# Patient Record
Sex: Female | Born: 1980 | Race: Black or African American | Hispanic: No | Marital: Single | Smoking: Current every day smoker
Health system: Southern US, Community
[De-identification: ages and names within clinical notes are randomized; demographics above are authoritative.]

## PROBLEM LIST (undated history)

## (undated) DIAGNOSIS — D649 Anemia, unspecified: Secondary | ICD-10-CM

## (undated) HISTORY — PX: JOINT REPLACEMENT: SHX530

## (undated) HISTORY — PX: WRIST SURGERY: SHX841

---
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
## 1999-01-21 ENCOUNTER — Emergency Department (HOSPITAL_COMMUNITY): Admission: EM | Admit: 1999-01-21 | Discharge: 1999-01-21 | Payer: Self-pay | Admitting: Emergency Medicine

## 1999-07-30 ENCOUNTER — Encounter: Payer: Self-pay | Admitting: Emergency Medicine

## 1999-07-30 ENCOUNTER — Emergency Department (HOSPITAL_COMMUNITY): Admission: EM | Admit: 1999-07-30 | Discharge: 1999-07-30 | Payer: Self-pay | Admitting: Emergency Medicine

## 2000-07-10 ENCOUNTER — Encounter: Admission: RE | Admit: 2000-07-10 | Discharge: 2000-07-10 | Payer: Self-pay | Admitting: Family Medicine

## 2000-08-09 ENCOUNTER — Encounter: Admission: RE | Admit: 2000-08-09 | Discharge: 2000-08-09 | Payer: Self-pay | Admitting: Family Medicine

## 2000-12-12 ENCOUNTER — Encounter: Admission: RE | Admit: 2000-12-12 | Discharge: 2000-12-12 | Payer: Self-pay | Admitting: Family Medicine

## 2001-08-19 ENCOUNTER — Encounter: Admission: RE | Admit: 2001-08-19 | Discharge: 2001-08-19 | Payer: Self-pay | Admitting: Family Medicine

## 2001-08-25 ENCOUNTER — Encounter: Admission: RE | Admit: 2001-08-25 | Discharge: 2001-08-25 | Payer: Self-pay | Admitting: Family Medicine

## 2001-09-04 ENCOUNTER — Encounter: Admission: RE | Admit: 2001-09-04 | Discharge: 2001-09-04 | Payer: Self-pay | Admitting: Family Medicine

## 2001-09-09 ENCOUNTER — Ambulatory Visit (HOSPITAL_COMMUNITY): Admission: RE | Admit: 2001-09-09 | Discharge: 2001-09-09 | Payer: Self-pay | Admitting: Family Medicine

## 2001-09-10 ENCOUNTER — Encounter: Admission: RE | Admit: 2001-09-10 | Discharge: 2001-09-10 | Payer: Self-pay | Admitting: Family Medicine

## 2001-09-12 ENCOUNTER — Encounter: Admission: RE | Admit: 2001-09-12 | Discharge: 2001-09-12 | Payer: Self-pay | Admitting: Family Medicine

## 2001-09-29 ENCOUNTER — Inpatient Hospital Stay (HOSPITAL_COMMUNITY): Admission: AD | Admit: 2001-09-29 | Discharge: 2001-09-29 | Payer: Self-pay | Admitting: Obstetrics and Gynecology

## 2001-10-03 ENCOUNTER — Encounter: Admission: RE | Admit: 2001-10-03 | Discharge: 2001-10-03 | Payer: Self-pay | Admitting: Family Medicine

## 2001-10-14 ENCOUNTER — Encounter: Admission: RE | Admit: 2001-10-14 | Discharge: 2001-10-14 | Payer: Self-pay | Admitting: Family Medicine

## 2001-10-20 ENCOUNTER — Encounter: Admission: RE | Admit: 2001-10-20 | Discharge: 2001-10-20 | Payer: Self-pay | Admitting: Family Medicine

## 2001-10-29 ENCOUNTER — Encounter: Admission: RE | Admit: 2001-10-29 | Discharge: 2001-10-29 | Payer: Self-pay | Admitting: Family Medicine

## 2001-11-12 ENCOUNTER — Encounter: Admission: RE | Admit: 2001-11-12 | Discharge: 2001-11-12 | Payer: Self-pay | Admitting: Family Medicine

## 2001-11-13 ENCOUNTER — Encounter: Admission: RE | Admit: 2001-11-13 | Discharge: 2001-11-13 | Payer: Self-pay | Admitting: Family Medicine

## 2001-11-14 ENCOUNTER — Ambulatory Visit (HOSPITAL_COMMUNITY): Admission: RE | Admit: 2001-11-14 | Discharge: 2001-11-14 | Payer: Self-pay | Admitting: Family Medicine

## 2001-11-17 ENCOUNTER — Encounter: Admission: RE | Admit: 2001-11-17 | Discharge: 2001-11-17 | Payer: Self-pay | Admitting: Family Medicine

## 2001-11-19 ENCOUNTER — Encounter: Admission: RE | Admit: 2001-11-19 | Discharge: 2001-11-19 | Payer: Self-pay | Admitting: Family Medicine

## 2001-11-26 ENCOUNTER — Encounter: Admission: RE | Admit: 2001-11-26 | Discharge: 2001-11-26 | Payer: Self-pay | Admitting: Family Medicine

## 2001-12-03 ENCOUNTER — Encounter: Admission: RE | Admit: 2001-12-03 | Discharge: 2001-12-03 | Payer: Self-pay | Admitting: Sports Medicine

## 2001-12-04 ENCOUNTER — Encounter: Admission: RE | Admit: 2001-12-04 | Discharge: 2001-12-04 | Payer: Self-pay | Admitting: Family Medicine

## 2009-02-05 HISTORY — PX: HIP SURGERY: SHX245

## 2011-08-16 ENCOUNTER — Encounter (HOSPITAL_BASED_OUTPATIENT_CLINIC_OR_DEPARTMENT_OTHER): Payer: Self-pay | Admitting: *Deleted

## 2011-08-16 ENCOUNTER — Emergency Department (HOSPITAL_BASED_OUTPATIENT_CLINIC_OR_DEPARTMENT_OTHER)
Admission: EM | Admit: 2011-08-16 | Discharge: 2011-08-16 | Disposition: A | Discharge status: Home / Self Care | Payer: Medicaid Other | Attending: Emergency Medicine | Admitting: Emergency Medicine

## 2011-08-16 DIAGNOSIS — S8000XA Contusion of unspecified knee, initial encounter: Secondary | ICD-10-CM

## 2011-08-16 DIAGNOSIS — S62318A Displaced fracture of base of other metacarpal bone, initial encounter for closed fracture: Secondary | ICD-10-CM

## 2011-08-16 DIAGNOSIS — D649 Anemia, unspecified: Secondary | ICD-10-CM | POA: Insufficient documentation

## 2011-08-16 DIAGNOSIS — W19XXXA Unspecified fall, initial encounter: Secondary | ICD-10-CM | POA: Insufficient documentation

## 2011-08-16 DIAGNOSIS — Y9229 Other specified public building as the place of occurrence of the external cause: Secondary | ICD-10-CM | POA: Insufficient documentation

## 2011-08-16 DIAGNOSIS — S92309A Fracture of unspecified metatarsal bone(s), unspecified foot, initial encounter for closed fracture: Secondary | ICD-10-CM | POA: Insufficient documentation

## 2011-08-16 DIAGNOSIS — F17200 Nicotine dependence, unspecified, uncomplicated: Secondary | ICD-10-CM | POA: Insufficient documentation

## 2011-08-16 HISTORY — DX: Anemia, unspecified: D64.9

## 2011-08-16 MED ORDER — HYDROCODONE-ACETAMINOPHEN 5-325 MG PO TABS
2.0000 | ORAL_TABLET | Freq: Once | ORAL | Status: AC
  Administered 2011-08-16: 2 via ORAL
  Filled 2011-08-16: qty 2

## 2011-08-16 MED ORDER — HYDROCODONE-ACETAMINOPHEN 5-325 MG PO TABS: 1.0000 | ORAL_TABLET | Freq: Four times a day (QID) | ORAL | Status: AC | PRN

## 2011-08-16 NOTE — ED Provider Notes (Signed)
History     CSN: [id number]  Arrival date & time 08/16/11  0018   First MD Initiated Contact with Patient 08/16/11 0102      Chief Complaint  Patient presents with  . Foot Injury    (Consider location/radiation/quality/duration/timing/severity/associated sxs/prior treatment) HPI This is a 31 year old black female who fell at [facility] yesterday afternoon. She has pain and swelling of the right kneecap as well as pain over the base of the left fifth metatarsal. She was seen by her physician yesterday afternoon and had x-rays of her right knee left ankle and left foot. These show no fracture of the right knee or the left ankle but does show a fracture of the base of the left fifth metatarsal. She is having moderate to severe pain, worse with walking. She was given a postop shoe. She states she was given Ultram for pain which has not adequately treated it.   Past Medical History  Diagnosis Date  . Anemia     Past Surgical History  Procedure Date  . Joint replacement     History reviewed. No pertinent family history.  History  Substance Use Topics  . Smoking status: Current Everyday Smoker -- 1.0 packs/day  . Smokeless tobacco: Not on file  . Alcohol Use: No    OB History    Grav Para Term Preterm Abortions TAB SAB Ect Mult Living                  Review of Systems  All other systems reviewed and are negative.    Allergies  Review of patient's allergies indicates no known allergies.  Home Medications  No current outpatient prescriptions on file.  BP 118/70  Pulse 85  Temp 97.8 F (36.6 C) (Oral)  Resp 16  Ht 5' (1.524 m)  Wt 197 lb (89.359 kg)  BMI 38.47 kg/m2  SpO2 100%  Physical Exam General: Well-developed, well-nourished female in no acute distress; appearance consistent with age of record HENT: normocephalic, atraumatic Eyes: Normal appearance Neck: supple Heart: regular rate and rhythm Lungs: Normal respiratory effort and excursion Abdomen:  soft; nondistended Extremities: Tender hematoma over right patella without crepitus; tenderness and ecchymosis over the base of the left fifth metatarsal; left foot distally neurovascularly intact with intact tendon function Neurologic: Awake, alert and oriented; motor function intact in all extremities and symmetric; no facial droop Skin: Warm and dry Psychiatric: Normal mood and affect    ED Course  Procedures (including critical care time)     MDM  Outside x-rays reviewed by myself showing fracture of the base of the left fifth metatarsal. We will place her in a cam walker and refer her to orthopedics.      Hanley Seamen, MD 08/16/11 0127

## 2011-08-16 NOTE — ED Notes (Signed)
Pt c/o fall yesterday seen by PMD  Dx left foot fx , tramadol not working

## 2014-10-27 ENCOUNTER — Emergency Department (HOSPITAL_BASED_OUTPATIENT_CLINIC_OR_DEPARTMENT_OTHER)
Admission: EM | Admit: 2014-10-27 | Discharge: 2014-10-27 | Disposition: A | Discharge status: Home / Self Care | Payer: Medicaid Other | Attending: Emergency Medicine | Admitting: Emergency Medicine

## 2014-10-27 ENCOUNTER — Emergency Department (HOSPITAL_BASED_OUTPATIENT_CLINIC_OR_DEPARTMENT_OTHER): Payer: Medicaid Other

## 2014-10-27 ENCOUNTER — Encounter (HOSPITAL_BASED_OUTPATIENT_CLINIC_OR_DEPARTMENT_OTHER): Payer: Self-pay

## 2014-10-27 DIAGNOSIS — Z720 Tobacco use: Secondary | ICD-10-CM | POA: Insufficient documentation

## 2014-10-27 DIAGNOSIS — R0789 Other chest pain: Secondary | ICD-10-CM | POA: Diagnosis not present

## 2014-10-27 DIAGNOSIS — Z862 Personal history of diseases of the blood and blood-forming organs and certain disorders involving the immune mechanism: Secondary | ICD-10-CM | POA: Diagnosis not present

## 2014-10-27 DIAGNOSIS — Z3202 Encounter for pregnancy test, result negative: Secondary | ICD-10-CM | POA: Diagnosis not present

## 2014-10-27 DIAGNOSIS — R109 Unspecified abdominal pain: Secondary | ICD-10-CM | POA: Diagnosis present

## 2014-10-27 LAB — URINALYSIS, ROUTINE W REFLEX MICROSCOPIC
Bilirubin Urine: NEGATIVE
GLUCOSE, UA: NEGATIVE mg/dL
Hgb urine dipstick: NEGATIVE
Ketones, ur: NEGATIVE mg/dL
LEUKOCYTES UA: NEGATIVE
NITRITE: NEGATIVE
PH: 7.5 (ref 5.0–8.0)
Protein, ur: NEGATIVE mg/dL
SPECIFIC GRAVITY, URINE: 1.026 (ref 1.005–1.030)
Urobilinogen, UA: 1 mg/dL (ref 0.0–1.0)

## 2014-10-27 LAB — PREGNANCY, URINE: Preg Test, Ur: NEGATIVE

## 2014-10-27 MED ORDER — METHOCARBAMOL 500 MG PO TABS: 1000.0000 mg | ORAL_TABLET | Freq: Four times a day (QID) | ORAL | Status: DC | PRN

## 2014-10-27 MED ORDER — IBUPROFEN 800 MG PO TABS
800.0000 mg | ORAL_TABLET | Freq: Once | ORAL | Status: AC
  Administered 2014-10-27: 800 mg via ORAL
  Filled 2014-10-27: qty 1

## 2014-10-27 MED ORDER — METHOCARBAMOL 500 MG PO TABS
750.0000 mg | ORAL_TABLET | Freq: Once | ORAL | Status: AC
Start: 2014-10-27 — End: 2014-10-27
  Administered 2014-10-27: 750 mg via ORAL
  Filled 2014-10-27: qty 2

## 2014-10-27 NOTE — Discharge Instructions (Signed)
For pain control you may take up to 800mg  of Motrin (also known as ibuprofen). That is usually 4 over the counter pills,  3 times a day. Take with food to minimize stomach irritation   For breakthrough pain you may take Robaxin. Do not drink alcohol, drive or operate heavy machinery when taking Robaxin.   Do not hesitate to return to the emergency room for any new, worsening or concerning symptoms.  Please obtain primary care using resource guide below. Let them know that you were seen in the emergency room and that they will need to obtain records for further outpatient management.   Chest Wall Pain Chest wall pain is pain in or around the bones and muscles of your chest. It may take up to 6 weeks to get better. It may take longer if you must stay physically active in your work and activities.  CAUSES  Chest wall pain may happen on its own. However, it may be caused by:  A viral illness like the flu.  Injury.  Coughing.  Exercise.  Arthritis.  Fibromyalgia.  Shingles. HOME CARE INSTRUCTIONS   Avoid overtiring physical activity. Try not to strain or perform activities that cause pain. This includes any activities using your chest or your abdominal and side muscles, especially if heavy weights are used.  Put ice on the sore area.  Put ice in a plastic bag.  Place a towel between your skin and the bag.  Leave the ice on for 15-20 minutes per hour while awake for the first 2 days.  Only take over-the-counter or prescription medicines for pain, discomfort, or fever as directed by your caregiver. SEEK IMMEDIATE MEDICAL CARE IF:   Your pain increases, or you are very uncomfortable.  You have a fever.  Your chest pain becomes worse.  You have new, unexplained symptoms.  You have nausea or vomiting.  You feel sweaty or lightheaded.  You have a cough with phlegm (sputum), or you cough up blood. MAKE SURE YOU:   Understand these instructions.  Will watch your  condition.  Will get help right away if you are not doing well or get worse. Document Released: 01/22/2005 Document Revised: 04/16/2011 Document Reviewed: 09/18/2010 [facility] Patient Information 2015 [place], [place]. This information is not intended to replace advice given to you by your health care provider. Make sure you discuss any questions you have with your health care provider.   Emergency Department Resource Guide 1) Find a Doctor and Pay Out of Pocket Although you won't have to find out who is covered by your insurance plan, it is a good idea to ask around and get recommendations. You will then need to call the office and see if the doctor you have chosen will accept you as a new patient and what types of options they offer for patients who are self-pay. Some doctors offer discounts or will set up payment plans for their patients who do not have insurance, but you will need to ask so you aren't surprised when you get to your appointment.  2) Contact Your Local Health Department Not all health departments have doctors that can see patients for sick visits, but many do, so it is worth a call to see if yours does. If you don't know where your local health department is, you can check in your phone book. The CDC also has a tool to help you locate your state's health department, and many state websites also have listings of all of their local health  departments.  3) Find a Walk-in Clinic If your illness is not likely to be very severe or complicated, you may want to try a walk in clinic. These are popping up all over the country in pharmacies, drugstores, and shopping centers. They're usually staffed by nurse practitioners or physician assistants that have been trained to treat common illnesses and complaints. They're usually fairly quick and inexpensive. However, if you have serious medical issues or chronic medical problems, these are probably not your best option.  No Primary Care  Doctor: - Call Health Connect at  [phone] - they can help you locate a primary care doctor that  accepts your insurance, provides certain services, etc. - Physician Referral Service- [phone]  Chronic Pain Problems: Organization         Address  Phone   Notes  [facility]  [phone] Patients need to be referred by their primary care doctor.   Medication Assistance: Organization         Address  Phone   Notes  [facility] Medication [facility] [address] [place] [place], [place] [address] [phone] --Must be a resident of [facility] -- Must have NO insurance coverage whatsoever (no Medicaid/ Medicare, etc.) -- The pt. MUST have a primary care doctor that directs their care regularly and follows them in the community   [facility]  [phone]   [employer]  [phone]    Agencies that provide inexpensive medical care: Organization         Address  Phone   Notes  [facility]  [phone]   [facility]    [phone]   [facility] [address] [place], [place] [address] [phone]   [facility] [address], [place] [phone]   Planned Parenthood    [phone]   [facility]    [phone]   Community Health and [facility]  [address], [place] Phone:  [phone], Fax:  [phone] Hours of Operation:  9 am - 6 pm, M-F.  Also accepts Medicaid/Medicare and self-pay.  [facility]  [address], [place] Phone: [phone], Fax: [phone]. Hours of Operation:  8:30 am - 5:30 pm, M-F.  Also accepts Medicaid and self-pay.  [facility] [place] [address], [place] Phone: [phone]   [facility] [address] Natasha Bence [place], [place] [phone], Ext. [phone] Mondays & Thursdays: 7-9 AM.  First 15 patients are seen on a first  come, first serve basis.    Medicaid-accepting [facility] Providers:  Organization         Address  Phone   Notes  [facility] [address], [place] [phone] Also accepts self-pay patients.  [facility] [address] Laurell Josephs [place], [place]  [phone]   [facility] [address], [place] [phone]   [facility] [address] [place] [phone]   Renaye Rakers [address], [place]   [phone] Only accepts [place] patients after they have their name applied to their card.   Self-Pay (no insurance) in [facility]:  Organization         Address  Phone   Notes  Sickle Cell Patients, [facility] [address] [place], [place] [phone]   [facility] [address] [place], [place] [phone]   [facility] [place]  [address], [place] [phone]   [facility]/Dr. Osei-Bonsu  [address] [place] or [address], [place] [phone] Phone number for both [place] and [place] locations is the same.  [facility] and [facility] [address] [place] [phone]   [facility] [address], [place] or [address] [phone]   [facility] [address] [place] [phone], phone; [phone], fax Sees patients 1st and 3rd Saturday of every month.  Must not qualify for public or private insurance (i.e. Medicaid, Medicare, [place] Health Choice, Veterans' Benefits)  Household income should be no more than 200% of the poverty level The clinic cannot treat you if you are pregnant or think you are pregnant  Sexually transmitted diseases are not treated at the clinic.    Dental Care: Organization          Address  Phone  Notes  [facility] of [facility] [address] [place], [place] [phone] Accepts children up to age 43 who are enrolled in [place] or [place] Health Choice; pregnant women with a Medicaid card; and children who have applied for Medicaid or [place] Health Choice, but were declined, whose parents can pay a reduced fee at time of service.  [facility] Department of [facility]  [address], [place] [phone] Accepts children up to age 40 who are enrolled in [place] or [place] Health Choice; pregnant women with a Medicaid card; and children who have applied for Medicaid or [place] Health Choice, but were declined, whose parents can pay a reduced fee at time of service.  [facility]  [address] [place], [place] [phone] Patients are seen by appointment only. Walk-ins are not accepted. [facility] will see patients 67 years of age and older. Monday - Tuesday (8am-5pm) Most Wednesdays (8:30-5pm) $30 per visit, cash only  [facility] Access PRO[facility]  [address], [facility] [phone] Patients are seen by appointment only. Walk-ins are not accepted. [facility] will see patients 28 years of age and older. One Wednesday Evening (Monthly: Volunteer Based).  $30 per visit, cash only  [employer]  [phone] for adults; Children under age 69, call Graduate Pediatric Dentistry at [phone]. Children aged 49-14, please call [phone] to request a pediatric application.  Dental services are provided in all areas of dental care including fillings, crowns and bridges, complete and partial dentures, implants, gum treatment, root canals, and extractions. Preventive care is also provided. Treatment is provided to both adults and children. Patients are selected via a lottery and there is often a waiting list.   [facility] [address], [place]  [phone] [email or web address]   [facility] [address] [place], [place] [phone], Ext. [phone] Second and Fourth Thursday of each month, opens at 6:30 AM; Clinic ends at 9 AM.  Patients are seen on a first-come first-served basis, and a limited number are seen during each clinic.   [facility]  [address] Ether Griffins [place], [place] [phone]   Eligibility Requirements You must have lived  in [place], [place], or [place] counties for at least the last three months.   You cannot be eligible for state or federal sponsored National Cityhealthcare insurance, including CIGNAVeterans Administration, [place]icaid, or [employer]icare.   You generally cannot be eligible for healthcare insurance through your employer.    How to apply: Eligibility screenings are held every Tuesday and Wednesday afternoon from 1:00 pm until 4:00 pm. You do not need an appointment for the interview!  [facility] [address], [place], [place] [phone]   [facility]  [phone]   [facility]  [phone]   [facility]  [phone]    Behavioral Health Resources in the Community: Intensive Outpatient Programs Organization         Address  Phone  Notes  [facility] [address], [place], [place] [phone]   [facility]patient [address], [place], [place] [phone]   [facility] [address], [place], [place]  [phone]   [facility] [address],  [place], [place] [phone] or [phone]   Substance Abuse Resources Organization         Address  Phone  Notes  [facility]  [phone]   [facility]  [phone]   [facility]  [phone]   [facility]  [phone]   Residential & Outpatient Substance Abuse Program  [phone]   Psychological  Services Organization         Address  Phone  Notes  [facility]  [phone]- [phone]   [facility]  [phone]- [phone]   [facility] [address], [place] [phone] or [phone]    Mobile Crisis Teams Organization         Address  Phone  Notes  [facility], Mobile Crisis Care Unit  [phone]   [facility]  [address] [place], [place] [phone]   Doristine LocksSharon DeEsch [address] [place] [phone]    Self-Help/Support Groups Organization         Address  Phone             Notes  [facility] [place] - variety of support groups  [phone] Call for more information  Narcotics Anonymous (NA), [facility] [address], [place]  2 meetings at this location   Statisticianesidential Treatment Programs Organization         Address  Phone  Notes  [facility] [address],    [place]  [phone]   [facility]  [address], [place] [phone], [place], [place] [phone]   [facility] [address], [place] [phone] Admissions: 8am-3pm M-F  [facility] [address]    [place], [place] [phone]   [facility] [address], [place], [place] [phone]   [facility] [address]  [place], [place] [phone]   [facility] - Intensive Outpatient [address], [place], [place] [phone]   [facility] ([facility] [address]  [place], [place] [phone] or [phone]   [facility] ([facility]) [address] [place], [place] [phone] Accepts Medicaid  [facility] [address]  [place] [phone] Substance Abuse/Addiction Treatment   [facility] Resources Organization         Address  Phone  Notes  [facility]  [phone]   Angie Fava, PhD [address] Ervin Knack [place], [place]   [phone] or [phone]   [facility] Behavioral   [address] [place], [place] [phone]   [facility] [address], [place], [place] [phone] Insurance/Medicaid/sponsorship through [facility] and Families [address]                                    [place], [place] [phone] Therapy/tele-psych/case  [facility] [address], [place] [phone]    Dr. Lolly Mustache  [phone]   [facility]  [employer] [facility] Dept. 1) [address] [place] 2) [address] [place] 3)  [address], [place] [phone]  [phone]   [facility] Child Abuse Hotline [phone] or [phone] (After Hours)

## 2014-10-27 NOTE — ED Provider Notes (Signed)
CSN: [id number]     Arrival date & time 10/27/14  1320 History   First MD Initiated Contact with Patient 10/27/14 1337     Chief Complaint  Patient presents with  . Flank Pain     (Consider location/radiation/quality/duration/timing/severity/associated sxs/prior Treatment) HPI  Blood pressure 124/62, pulse 74, temperature 98.4 F (36.9 C), temperature source Oral, resp. rate 18, height 5' (1.524 m), weight 173 lb (78.472 kg), last menstrual period 10/07/2014, SpO2 100 %.  Taylor Clark is a 34 y.o. female complaining of left sided lower midaxillary pain. Pain started approximately 1 week ago when she woke up. If she does not move, she has no pain; however, with movement pain is a 8/10, sharp in nature. Occasionally worse with deep inspiration. Denies any radiation of pain. 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 Denies trauma to the area. No urinary complaints, no abdominal complaints, no fever, chills, or night sweats. Endorses URI symptoms including nasal congestion and sinus pressure and non-productive cough x 3-4 days with sick contacts. Some shortness of breath. Denies recent travel or immobilization. No history of VTE. No lower leg pain or swelling.   Past Medical History  Diagnosis Date  . Anemia    Past Surgical History  Procedure Laterality Date  . Joint replacement    . Hip surgery    . Wrist surgery     No family history on file. Social History  Substance Use Topics  . Smoking status: Current Every Day Smoker -- 1.00 packs/day  . Smokeless tobacco: None  . Alcohol Use: No   OB History    No data available     Review of Systems  10 systems reviewed and found to be negative, except as noted in the HPI.  Allergies  Review of patient's allergies indicates no known allergies.  Home Medications   Prior to Admission medications   Medication Sig Start Date End Date Taking?  Authorizing Provider  UNKNOWN TO PATIENT Osteoporosis med   Yes Historical Provider, MD  methocarbamol (ROBAXIN) 500 MG tablet Take 2 tablets (1,000 mg total) by mouth 4 (four) times daily as needed (Pain). 10/27/14   Nicole Pisciotta, PA-C   BP 124/62 mmHg  Pulse 74  Temp(Src) 98.4 F (36.9 C) (Oral)  Resp 18  Ht 5' (1.524 m)  Wt 173 lb (78.472 kg)  BMI 33.79 kg/m2  SpO2 100%  LMP 10/07/2014 Physical Exam  Constitutional: She is oriented to person, place, and time. She appears well-developed and well-nourished. No distress.  HENT:  Head: Normocephalic.  Mouth/Throat: Oropharynx is clear and moist.  Eyes: Conjunctivae and EOM are normal.  Neck: Normal range of motion.  Cardiovascular: Normal rate, regular rhythm and intact distal pulses.   Pulmonary/Chest: Effort normal and breath sounds normal. No stridor. No respiratory distress. She has no wheezes. She has no rales. She exhibits tenderness.  Abdominal: Soft. Bowel sounds are normal. She exhibits no distension and no mass. There is no tenderness. There is no rebound and no guarding.  Genitourinary:  No CVA tenderness to percussion bilaterally  Musculoskeletal: Normal range of motion.  Neurological: She is alert and oriented to person, place, and time.  Psychiatric: She has a normal mood and affect.  Nursing note and vitals reviewed.   ED Course  Procedures (including critical care time) Labs Review Labs Reviewed  URINALYSIS, ROUTINE W REFLEX MICROSCOPIC (NOT AT [facility]) - Abnormal; Notable for the following:  APPearance CLOUDY (*)    All other components within normal limits  PREGNANCY, URINE    Imaging Review Dg Chest 2 View  10/27/2014   CLINICAL DATA:  Left side back pain, cough for 1 week.  Smoker.  EXAM: CHEST  2 VIEW  COMPARISON:  None.  FINDINGS: The heart size and mediastinal contours are within normal limits. Both lungs are clear. The visualized skeletal structures are unremarkable.  IMPRESSION: No active  cardiopulmonary disease.   Electronically Signed   By: Charlett Nose M.D.   On: 10/27/2014 14:10   I have personally reviewed and evaluated these images and lab results as part of my medical decision-making.   EKG Interpretation None      MDM   Final diagnoses:  Chest wall pain    Filed Vitals:   10/27/14 1327  BP: 124/62  Pulse: 74  Temp: 98.4 F (36.9 C)  TempSrc: Oral  Resp: 18  Height: 5' (1.524 m)  Weight: 173 lb (78.472 kg)  SpO2: 100%    Medications  methocarbamol (ROBAXIN) tablet 750 mg (750 mg Oral Given 10/27/14 1422)  ibuprofen (ADVIL,MOTRIN) tablet 800 mg (800 mg Oral Given 10/27/14 1423)    Taylor Clark is a pleasant 34 y.o. female presenting with positional left midaxillary lower pain. This is reproducible to palpation. Patient is active with lifting and is physical at her job. Patient's vital signs are stable, x-ray clear, no CVA tenderness, urinalysis is not consistent with infection, I doubt this is a pyelonephritis. Patient is low risk by Wells criteria and PERC negative.  Evaluation does not show pathology that would require ongoing emergent intervention or inpatient treatment. Pt is hemodynamically stable and mentating appropriately. Discussed findings and plan with patient/guardian, who agrees with care plan. All questions answered. Return precautions discussed and outpatient follow up given.   New Prescriptions   METHOCARBAMOL (ROBAXIN) 500 MG TABLET    Take 2 tablets (1,000 mg total) by mouth 4 (four) times daily as needed (Pain).         Wynetta Emery, PA-C 10/27/14 1458  Marily Memos, MD 10/27/14 [phone]

## 2014-10-27 NOTE — ED Notes (Addendum)
Left flank/mid back pain x 1 week-denies injury-also c/o prod cough

## 2015-12-15 ENCOUNTER — Encounter (HOSPITAL_BASED_OUTPATIENT_CLINIC_OR_DEPARTMENT_OTHER): Payer: Self-pay | Admitting: *Deleted

## 2015-12-15 ENCOUNTER — Emergency Department (HOSPITAL_BASED_OUTPATIENT_CLINIC_OR_DEPARTMENT_OTHER): Payer: Self-pay

## 2015-12-15 ENCOUNTER — Emergency Department (HOSPITAL_BASED_OUTPATIENT_CLINIC_OR_DEPARTMENT_OTHER)
Admission: EM | Admit: 2015-12-15 | Discharge: 2015-12-15 | Disposition: A | Discharge status: Home / Self Care | Payer: Self-pay | Attending: Emergency Medicine | Admitting: Emergency Medicine

## 2015-12-15 DIAGNOSIS — K7689 Other specified diseases of liver: Secondary | ICD-10-CM | POA: Insufficient documentation

## 2015-12-15 DIAGNOSIS — K529 Noninfective gastroenteritis and colitis, unspecified: Secondary | ICD-10-CM | POA: Insufficient documentation

## 2015-12-15 DIAGNOSIS — F17200 Nicotine dependence, unspecified, uncomplicated: Secondary | ICD-10-CM | POA: Insufficient documentation

## 2015-12-15 LAB — URINALYSIS, ROUTINE W REFLEX MICROSCOPIC
Bilirubin Urine: NEGATIVE
GLUCOSE, UA: NEGATIVE mg/dL
HGB URINE DIPSTICK: NEGATIVE
Ketones, ur: NEGATIVE mg/dL
Leukocytes, UA: NEGATIVE
Nitrite: NEGATIVE
Protein, ur: NEGATIVE mg/dL
SPECIFIC GRAVITY, URINE: 1.01 (ref 1.005–1.030)
pH: 7.5 (ref 5.0–8.0)

## 2015-12-15 LAB — COMPREHENSIVE METABOLIC PANEL
ALT: 27 U/L (ref 14–54)
AST: 25 U/L (ref 15–41)
Albumin: 4.2 g/dL (ref 3.5–5.0)
Alkaline Phosphatase: 109 U/L (ref 38–126)
Anion gap: 7 (ref 5–15)
BUN: 10 mg/dL (ref 6–20)
CHLORIDE: 105 mmol/L (ref 101–111)
CO2: 25 mmol/L (ref 22–32)
Calcium: 9.4 mg/dL (ref 8.9–10.3)
Creatinine, Ser: 0.65 mg/dL (ref 0.44–1.00)
GFR calc Af Amer: >60 mL/min (ref >60)
Glucose, Bld: 107 mg/dL — ABNORMAL HIGH (ref 65–99)
Potassium: 4 mmol/L (ref 3.5–5.1)
Sodium: 137 mmol/L (ref 135–145)
Total Bilirubin: 1.3 mg/dL — ABNORMAL HIGH (ref 0.3–1.2)
Total Protein: 7.3 g/dL (ref 6.5–8.1)

## 2015-12-15 LAB — CBC WITH DIFFERENTIAL/PLATELET
BASOS ABS: 0 10*3/uL (ref 0.0–0.1)
Basophils Relative: 0 %
EOS PCT: 1 %
Eosinophils Absolute: 0 10*3/uL (ref 0.0–0.7)
HCT: 44.3 % (ref 36.0–46.0)
HEMOGLOBIN: 15 g/dL (ref 12.0–15.0)
LYMPHS PCT: 24 %
Lymphs Abs: 1.4 10*3/uL (ref 0.7–4.0)
MCH: 30.3 pg (ref 26.0–34.0)
MCHC: 33.9 g/dL (ref 30.0–36.0)
MCV: 89.5 fL (ref 78.0–100.0)
Monocytes Absolute: 0.3 10*3/uL (ref 0.1–1.0)
Monocytes Relative: 5 %
NEUTROS ABS: 4 10*3/uL (ref 1.7–7.7)
NEUTROS PCT: 70 %
PLATELETS: 179 10*3/uL (ref 150–400)
RBC: 4.95 MIL/uL (ref 3.87–5.11)
RDW: 13.1 % (ref 11.5–15.5)
WBC: 5.8 10*3/uL (ref 4.0–10.5)

## 2015-12-15 LAB — PREGNANCY, URINE: Preg Test, Ur: NEGATIVE

## 2015-12-15 LAB — LIPASE, BLOOD: LIPASE: 20 U/L (ref 11–51)

## 2015-12-15 MED ORDER — KETOROLAC TROMETHAMINE 15 MG/ML IJ SOLN
15.0000 mg | Freq: Once | INTRAMUSCULAR | Status: AC
  Administered 2015-12-15: 15 mg via INTRAVENOUS
  Filled 2015-12-15: qty 1

## 2015-12-15 MED ORDER — SODIUM CHLORIDE 0.9 % IV BOLUS (SEPSIS)
1000.0000 mL | Freq: Once | INTRAVENOUS | Status: AC
  Administered 2015-12-15: 1000 mL via INTRAVENOUS

## 2015-12-15 MED ORDER — ONDANSETRON 4 MG PO TBDP: 4.0000 mg | ORAL_TABLET | Freq: Three times a day (TID) | ORAL | 0 refills | Status: DC | PRN

## 2015-12-15 MED ORDER — ONDANSETRON HCL 4 MG/2ML IJ SOLN
4.0000 mg | Freq: Once | INTRAMUSCULAR | Status: AC
  Administered 2015-12-15: 4 mg via INTRAVENOUS
  Filled 2015-12-15: qty 2

## 2015-12-15 MED ORDER — DICYCLOMINE HCL 10 MG/ML IM SOLN
20.0000 mg | Freq: Once | INTRAMUSCULAR | Status: AC
  Administered 2015-12-15: 20 mg via INTRAMUSCULAR
  Filled 2015-12-15: qty 2

## 2015-12-15 MED ORDER — IOPAMIDOL (ISOVUE-300) INJECTION 61%
100.0000 mL | Freq: Once | INTRAVENOUS | Status: AC | PRN
  Administered 2015-12-15: 100 mL via INTRAVENOUS

## 2015-12-15 MED ORDER — DICYCLOMINE HCL 20 MG PO TABS: 20.0000 mg | ORAL_TABLET | Freq: Three times a day (TID) | ORAL | 0 refills | Status: DC

## 2015-12-15 MED FILL — DICYCLOMINE 10 MG CAPSULE: 10 | 6 days supply | Qty: 40 | Fill #0

## 2015-12-15 MED FILL — ONDANSETRON ODT 4 MG TABLET: 4 | 6 days supply | Qty: 20 | Fill #0

## 2015-12-15 NOTE — ED Provider Notes (Signed)
[facility]   CSN: [id number] Arrival date & time: 12/15/15  0831     History   Chief Complaint Chief Complaint  Patient presents with  . Abdominal Pain    HPI Taylor Clark is a 35 y.o. female.  HPI   Abdominal pain started yesterday, generalized, feels like "everything going to fall out", hurts all through back, sharp/dull/cramping. This AM every time pain come breaks into sweat. Waxing and waning pain. Does seem to be associated with episodes of diarrhea. Nausea, no vomiting. Diarrhea, four times this AM, abdominal pain associated with the diarrhea. No recent abx.   Past Medical History:  Diagnosis Date  . Anemia     There are no active problems to display for this patient.   Past Surgical History:  Procedure Laterality Date  . HIP SURGERY    . JOINT REPLACEMENT    . WRIST SURGERY      OB History    No data available       Home Medications    Prior to Admission medications   Medication Sig Start Date End Date Taking? Authorizing Provider  dicyclomine (BENTYL) 20 MG tablet Take 1 tablet (20 mg total) by mouth 3 (three) times daily before meals. 12/15/15   Alvira Monday, MD  ondansetron (ZOFRAN ODT) 4 MG disintegrating tablet Take 1 tablet (4 mg total) by mouth every 8 (eight) hours as needed for nausea or vomiting. 12/15/15   Alvira Monday, MD    Family History History reviewed. No pertinent family history.  Social History Social History  Substance Use Topics  . Smoking status: Current Every Day Smoker    Packs/day: 1.00  . Smokeless tobacco: Never Used  . Alcohol use No     Allergies   Patient has no known allergies.   Review of Systems Review of Systems  Constitutional: Negative for appetite change and fever.  HENT: Negative for sore throat.   Eyes: Negative for visual disturbance.  Respiratory: Negative for cough and shortness of breath.   Cardiovascular: Negative for chest pain.  Gastrointestinal:  Positive for abdominal pain, diarrhea and nausea. Negative for constipation and vomiting.  Genitourinary: Negative for difficulty urinating, vaginal bleeding and vaginal discharge.  Musculoskeletal: Negative for back pain and neck pain.  Skin: Negative for rash.  Neurological: Negative for syncope and headaches.     Physical Exam Updated Vital Signs BP (!) 102/51 (BP Location: Left Arm)   Pulse 77   Temp 97.6 F (36.4 C) (Oral)   Resp 16   Ht 5' (1.524 m)   Wt 175 lb (79.4 kg)   SpO2 99%   BMI 34.18 kg/m   Physical Exam  Constitutional: She is oriented to person, place, and time. She appears well-developed and well-nourished. No distress.  HENT:  Head: Normocephalic and atraumatic.  Eyes: Conjunctivae and EOM are normal.  Neck: Normal range of motion.  Cardiovascular: Normal rate, regular rhythm, normal heart sounds and intact distal pulses.  Exam reveals no gallop and no friction rub.   No murmur heard. Pulmonary/Chest: Effort normal and breath sounds normal. No respiratory distress. She has no wheezes. She has no rales.  Abdominal: Soft. She exhibits no distension. There is tenderness (RLQ, LLQ). There is no guarding.  Musculoskeletal: She exhibits no edema or tenderness.  Neurological: She is alert and oriented to person, place, and time.  Skin: Skin is warm and dry. No rash noted. She is not diaphoretic. No erythema.  Nursing note and vitals reviewed.  ED Treatments / Results  Labs (all labs ordered are listed, but only abnormal results are displayed) Labs Reviewed  COMPREHENSIVE METABOLIC PANEL - Abnormal; Notable for the following:       Result Value   Glucose, Bld 107 (*)    Total Bilirubin 1.3 (*)    All other components within normal limits  CBC WITH DIFFERENTIAL/PLATELET  LIPASE, BLOOD  URINALYSIS, ROUTINE W REFLEX MICROSCOPIC (NOT AT [facility])  PREGNANCY, URINE    EKG  EKG Interpretation None       Radiology Ct Abdomen Pelvis W Contrast  Result  Date: 12/15/2015 CLINICAL DATA:  35 year old with abdominal pain. Nausea and diarrhea. EXAM: CT ABDOMEN AND PELVIS WITH CONTRAST TECHNIQUE: Multidetector CT imaging of the abdomen and pelvis was performed using the standard protocol following bolus administration of intravenous contrast. CONTRAST:  100mL ISOVUE-300 IOPAMIDOL (ISOVUE-300) INJECTION 61% COMPARISON:  None. FINDINGS: Lower chest: Lung bases are clear. Hepatobiliary: There are scattered hyperdense lesions throughout the liver. One of the larger lesions is in the anterior right hepatic lobe measuring up to 9.5 cm. There is another large lesion in the medial right hepatic lobe measuring up to 5.6 cm. Other hyperdense areas are much smaller and nodular. Despite the large number of hyperdense lesions in liver, there is not a large amount of mass effect or liver enlargement. Main portal vein appears to be patent. No biliary dilatation. Normal appearance of the gallbladder. Pancreas: Normal appearance of the pancreas without inflammation or duct dilatation. Spleen: Normal appearance of spleen without enlargement. Adrenals/Urinary Tract: Normal adrenal glands. Normal appearance both kidneys without hydronephrosis. Urinary bladder is unremarkable. Stomach/Bowel: Stomach is within normal limits. Appendix appears normal. No evidence of bowel wall thickening, distention, or inflammatory changes. Vascular/Lymphatic: No significant vascular findings are present. No enlarged abdominal or pelvic lymph nodes. Reproductive: Uterus is unremarkable except for an IUD. No gross abnormality to the ovary/adnexa. Other: No free fluid. No large abdominal wall hernia. Negative for free air. Musculoskeletal: Patient has plate and screw fixation in the right hemipelvis. Both hips are located. IMPRESSION: Abnormal appearance of the liver. There are numerous hyperdense lesions scattered throughout the liver. Differential diagnosis for these hepatic lesions include multiple focal  nodular hyperplasias and multiple hepatic adenomas. The lack of significant mass effect also raises the possibility that this could be related to multiple areas of fat sparing but probably less likely. Recommend further characterization of the liver with MRI with Eovist. Metastatic process or malignant neoplasms are thought to be less likely unless the patient has risk factors. These results were called by telephone at the time of interpretation on 12/15/2015 at 11:44 am to Dr. Alvira MondayERIN Nerissa Constantin , who verbally acknowledged these results. Electronically Signed   By: Richarda OverlieAdam  Henn M.D.   On: 12/15/2015 11:47    Procedures Procedures (including critical care time)  Medications Ordered in ED Medications  sodium chloride 0.9 % bolus 1,000 mL (1,000 mLs Intravenous New Bag/Given 12/15/15 0959)  ondansetron (ZOFRAN) injection 4 mg (4 mg Intravenous Given 12/15/15 1002)  ketorolac (TORADOL) 15 MG/ML injection 15 mg (15 mg Intravenous Given 12/15/15 1004)  dicyclomine (BENTYL) injection 20 mg (20 mg Intramuscular Given 12/15/15 1000)  iopamidol (ISOVUE-300) 61 % injection 100 mL (100 mLs Intravenous Contrast Given 12/15/15 1055)     Initial Impression / Assessment and Plan / ED Course  I have reviewed the triage vital signs and the nursing notes.  Pertinent labs & imaging results that were available during my care of the patient were  reviewed by me and considered in my medical decision making (see chart for details).  Clinical Course    35 year old female presents with concern for abdominal pain and diarrhea.   Patient denies concerns for vaginal discharge or STI, and doubt PID. Urine pregnancy negative. Urinalysis shows no sign of UTI. By history, suspect likely viral gastroenteritis, however patient has significant tenderness on exam, and CT abdomen and pelvis ordered which showed no acute abnormalities, however multiple liver nodules.Suspect patient's symptoms are likely secondary to viral gastroenteritis.  Gave rx for bentyl, zofran. Given abnormal appearance of the liver on CT, with multiple hyperdense lesions, recommend close outpatient MRI. Patient discharged in stable condition with understanding of reasons to return.   Final Clinical Impressions(s) / ED Diagnoses   Final diagnoses:  Liver nodule, multiple  Gastroenteritis    New Prescriptions New Prescriptions   DICYCLOMINE (BENTYL) 20 MG TABLET    Take 1 tablet (20 mg total) by mouth 3 (three) times daily before meals.   ONDANSETRON (ZOFRAN ODT) 4 MG DISINTEGRATING TABLET    Take 1 tablet (4 mg total) by mouth every 8 (eight) hours as needed for nausea or vomiting.     Alvira MondayErin Balraj Brayfield, MD 12/15/15 1200

## 2015-12-15 NOTE — ED Triage Notes (Signed)
Pt reports "severe" lower abd pain with diarrhea and nausea x last night. Denies fevers or any other c/o.

## 2015-12-31 ENCOUNTER — Encounter (HOSPITAL_BASED_OUTPATIENT_CLINIC_OR_DEPARTMENT_OTHER): Payer: Self-pay | Admitting: *Deleted

## 2015-12-31 ENCOUNTER — Emergency Department (HOSPITAL_BASED_OUTPATIENT_CLINIC_OR_DEPARTMENT_OTHER): Payer: Self-pay

## 2015-12-31 ENCOUNTER — Emergency Department (HOSPITAL_BASED_OUTPATIENT_CLINIC_OR_DEPARTMENT_OTHER)
Admission: EM | Admit: 2015-12-31 | Discharge: 2015-12-31 | Disposition: A | Discharge status: Home / Self Care | Payer: Self-pay | Attending: Emergency Medicine | Admitting: Emergency Medicine

## 2015-12-31 DIAGNOSIS — F17200 Nicotine dependence, unspecified, uncomplicated: Secondary | ICD-10-CM | POA: Insufficient documentation

## 2015-12-31 DIAGNOSIS — J181 Lobar pneumonia, unspecified organism: Secondary | ICD-10-CM | POA: Insufficient documentation

## 2015-12-31 DIAGNOSIS — Z79899 Other long term (current) drug therapy: Secondary | ICD-10-CM | POA: Insufficient documentation

## 2015-12-31 DIAGNOSIS — J189 Pneumonia, unspecified organism: Secondary | ICD-10-CM

## 2015-12-31 LAB — RAPID STREP SCREEN (MED CTR MEBANE ONLY): Streptococcus, Group A Screen (Direct): NEGATIVE

## 2015-12-31 MED ORDER — DOXYCYCLINE HYCLATE 100 MG PO TABS
100.0000 mg | ORAL_TABLET | Freq: Once | ORAL | Status: AC
  Administered 2015-12-31: 100 mg via ORAL
  Filled 2015-12-31: qty 1

## 2015-12-31 MED ORDER — DOXYCYCLINE HYCLATE 100 MG PO CAPS: 100.0000 mg | ORAL_CAPSULE | Freq: Two times a day (BID) | ORAL | 0 refills | Status: AC

## 2015-12-31 NOTE — Discharge Instructions (Signed)
Please read and follow all provided instructions.  Your diagnoses today include:  1. Community acquired pneumonia of right upper lobe of lung (HCC)     Tests performed today include: Blood counts and electrolytes Chest x-ray -- shows pneumonia Vital signs. See below for your results today.   Medications prescribed:   Take any prescribed medications only as directed.  Home care instructions:  Follow any educational materials contained in this packet.  Take the complete course of antibiotics that you were prescribed.   BE VERY CAREFUL not to take multiple medicines containing Tylenol (also called acetaminophen). Doing so can lead to an overdose which can damage your liver and cause liver failure and possibly death.   Follow-up instructions: Please follow-up with your primary care provider in the next 3 days for further evaluation of your symptoms and to ensure resolution of your infection.   Return instructions:  Please return to the Emergency Department if you experience worsening symptoms.  Return immediately with worsening breathing, worsening shortness of breath, or if you feel it is taking you more effort to breathe.  Please return if you have any other emergent concerns.  Additional Information:  Your vital signs today were: BP 119/80 (BP Location: Right Arm)    Pulse 100    Temp 98.6 F (37 C)    Resp 18    Ht 5' (1.524 m)    Wt 77.6 kg    LMP 12/28/2015    SpO2 98%    BMI 33.40 kg/m  If your blood pressure (BP) was elevated above 135/85 this visit, please have this repeated by your doctor within one month. --------------

## 2015-12-31 NOTE — ED Notes (Signed)
Productive cough x 1 week, yellow mucus and nasal congestion

## 2015-12-31 NOTE — ED Notes (Signed)
Patient transported to X-ray 

## 2015-12-31 NOTE — ED Provider Notes (Signed)
[facility]   CSN: [id number] Arrival date & time: 12/31/15  1203     History   Chief Complaint Chief Complaint  Patient presents with  . URI    HPI Taylor Clark is a 35 y.o. female.  HPI  35 y.o. female with presents to the Emergency Department today complaining of cough, congestion, sore throat x 1 week. Sick contacts with children. No fevers at home. No N/V/D. No CP/SOB/ABD pain. Notes no relief with OTC remedies. Has generalized body aches that she rates 5/10. No other symptoms noted.     Past Medical History:  Diagnosis Date  . Anemia     There are no active problems to display for this patient.   Past Surgical History:  Procedure Laterality Date  . HIP SURGERY    . JOINT REPLACEMENT    . WRIST SURGERY      OB History    No data available       Home Medications    Prior to Admission medications   Medication Sig Start Date End Date Taking? Authorizing Provider  dicyclomine (BENTYL) 20 MG tablet Take 1 tablet (20 mg total) by mouth 3 (three) times daily before meals. 12/15/15   Alvira MondayErin Schlossman, MD  ondansetron (ZOFRAN ODT) 4 MG disintegrating tablet Take 1 tablet (4 mg total) by mouth every 8 (eight) hours as needed for nausea or vomiting. 12/15/15   Alvira MondayErin Schlossman, MD   Family History No family history on file.  Social History Social History  Substance Use Topics  . Smoking status: Current Every Day Smoker    Packs/day: 1.00  . Smokeless tobacco: Never Used  . Alcohol use No   Allergies   Patient has no known allergies.   Review of Systems Review of Systems  Constitutional: Negative for fever.  HENT: Positive for congestion, rhinorrhea and sore throat.   Respiratory: Positive for cough. Negative for shortness of breath.   Cardiovascular: Negative for chest pain.  Gastrointestinal: Negative for diarrhea, nausea and vomiting.   Physical Exam Updated Vital Signs BP 119/80 (BP Location: Right Arm)   Pulse 100    Temp 98.6 F (37 C)   Resp 18   Ht 5' (1.524 m)   Wt 77.6 kg   SpO2 98%   BMI 33.40 kg/m   Physical Exam  Constitutional: She is oriented to person, place, and time. She appears well-developed and well-nourished. No distress.  HENT:  Head: Normocephalic and atraumatic.  Right Ear: Tympanic membrane, external ear and ear canal normal.  Left Ear: Tympanic membrane, external ear and ear canal normal.  Nose: Nose normal.  Mouth/Throat: Uvula is midline and mucous membranes are normal. No trismus in the jaw. Oropharyngeal exudate and posterior oropharyngeal erythema present. No tonsillar abscesses.  Eyes: EOM are normal. Pupils are equal, round, and reactive to light.  Neck: Normal range of motion. Neck supple. No tracheal deviation present.  Cardiovascular: Normal rate, regular rhythm, S1 normal, S2 normal, normal heart sounds, intact distal pulses and normal pulses.   Pulmonary/Chest: Effort normal. No respiratory distress. She has no decreased breath sounds. She has no wheezes. She has rhonchi in the right upper field. She has no rales.  Abdominal: Normal appearance and bowel sounds are normal. There is no tenderness.  Musculoskeletal: Normal range of motion.  Neurological: She is alert and oriented to person, place, and time.  Skin: Skin is warm and dry.  Psychiatric: She has a normal mood and affect. Her speech is  normal and behavior is normal. Thought content normal.   ED Treatments / Results  Labs (all labs ordered are listed, but only abnormal results are displayed) Labs Reviewed  RAPID STREP SCREEN (NOT AT [facility])  CULTURE, GROUP A STREP [facility]    EKG  EKG Interpretation None      Radiology Dg Chest 2 View  Result Date: 12/31/2015 CLINICAL DATA:  Cough EXAM: CHEST  2 VIEW COMPARISON:  10/27/2014 chest radiograph. FINDINGS: Stable cardiomediastinal silhouette with normal heart size. No pneumothorax. No pleural effusion. Minimal hazy right upper parahilar lung opacity.  Otherwise clear lungs. IMPRESSION: Minimal hazy right upper parahilar lung opacity, nonspecific, cannot exclude developing pneumonia. Recommend follow-up PA and lateral post treatment chest radiographs in 4-6 weeks. Electronically Signed   By: Delbert PhenixJason A Poff M.D.   On: 12/31/2015 13:03    Procedures Procedures (including critical care time)  Medications Ordered in ED Medications - No data to display   Initial Impression / Assessment and Plan / ED Course  I have reviewed the triage vital signs and the nursing notes.  Pertinent labs & imaging results that were available during my care of the patient were reviewed by me and considered in my medical decision making (see chart for details).  Clinical Course    Final Clinical Impressions(s) / ED Diagnoses  {I have reviewed and evaluated the relevant laboratory values. {I have reviewed and evaluated the relevant imaging studies.  {I have reviewed the relevant previous healthcare records.  {I obtained HPI from historian.   ED Course:  Assessment: Pt is a 35yF presents with URI symptoms x 1 week. Sick contacts with children. No fevers. No N/V/D . On exam, pt in NAD. VSS. Afebrile. Lungs with rhonchi RUL, Heart RRR. Abdomen nontender/soft. Rapid strep negative. Pt CXR shows possible pneumonia RUF. DC with CAP ABX. Follow up to PCP. Verbalizes understanding and is agreeable with plan. Pt is hemodynamically stable & in NAD prior to dc.   Disposition/Plan:  DC Home Additional Verbal discharge instructions given and discussed with patient.  Pt Instructed to f/u with PCP in the next week for evaluation and treatment of symptoms. Return precautions given Pt acknowledges and agrees with plan  Supervising Physician Loren Raceravid Yelverton, MD  Final diagnoses:  Community acquired pneumonia of right upper lobe of lung [facility](HCC)    New Prescriptions New Prescriptions   No medications on file     Audry Piliyler Nyema Hachey, PA-C 12/31/15 1409    Loren Raceravid Yelverton,  MD 01/01/16 1401

## 2015-12-31 NOTE — ED Triage Notes (Signed)
Patient states she has a one week history of sinus congestion and productive cough with thick yellow secretions.

## 2016-01-03 LAB — CULTURE, GROUP A STREP (THRC)

## 2016-01-05 ENCOUNTER — Ambulatory Visit (INDEPENDENT_AMBULATORY_CARE_PROVIDER_SITE_OTHER): Payer: BLUE CROSS/BLUE SHIELD | Admitting: Family Medicine

## 2016-01-05 ENCOUNTER — Ambulatory Visit (HOSPITAL_BASED_OUTPATIENT_CLINIC_OR_DEPARTMENT_OTHER)
Admission: RE | Admit: 2016-01-05 | Discharge: 2016-01-05 | Disposition: A | Discharge status: Home / Self Care | Payer: BLUE CROSS/BLUE SHIELD | Source: Ambulatory Visit | Attending: Family Medicine | Admitting: Family Medicine

## 2016-01-05 ENCOUNTER — Encounter: Payer: Self-pay | Admitting: Family Medicine

## 2016-01-05 VITALS — BP 105/72 | HR 88 | Temp 98.1°F | Ht 60.0 in | Wt 179.2 lb

## 2016-01-05 DIAGNOSIS — Z9889 Other specified postprocedural states: Secondary | ICD-10-CM | POA: Diagnosis not present

## 2016-01-05 DIAGNOSIS — Z975 Presence of (intrauterine) contraceptive device: Secondary | ICD-10-CM

## 2016-01-05 DIAGNOSIS — M25551 Pain in right hip: Secondary | ICD-10-CM

## 2016-01-05 DIAGNOSIS — J189 Pneumonia, unspecified organism: Secondary | ICD-10-CM

## 2016-01-05 MED ORDER — DICLOFENAC SODIUM 75 MG PO TBEC: 75.0000 mg | DELAYED_RELEASE_TABLET | Freq: Two times a day (BID) | ORAL | 3 refills | Status: DC

## 2016-01-05 NOTE — Patient Instructions (Signed)
It was very nice to see you today! We will get x-rays of your right hip today and I will let you know how they look.  Please try the voltaren twice a day as needed for your hip pain- let me know if this is helpful for you We will refer you to OB-GYN to discuss your IUD  Please come and see me in about one month to have a repeat chest film so we can make sure your pneumonia is cleared up
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
## 2016-01-05 NOTE — Progress Notes (Signed)
Pre visit review using our clinic review tool, if applicable. No additional management support is needed unless otherwise documented below in the visit note. 

## 2016-02-01 ENCOUNTER — Ambulatory Visit: Payer: BLUE CROSS/BLUE SHIELD | Admitting: Family Medicine

## 2016-02-03 ENCOUNTER — Ambulatory Visit (INDEPENDENT_AMBULATORY_CARE_PROVIDER_SITE_OTHER): Payer: BLUE CROSS/BLUE SHIELD | Admitting: Family Medicine

## 2016-02-03 ENCOUNTER — Encounter: Payer: Self-pay | Admitting: Family Medicine

## 2016-02-03 VITALS — BP 119/84 | HR 77 | Temp 98.2°F | Ht 60.0 in | Wt 185.6 lb

## 2016-02-03 DIAGNOSIS — M25551 Pain in right hip: Secondary | ICD-10-CM | POA: Diagnosis not present

## 2016-02-03 DIAGNOSIS — G8929 Other chronic pain: Secondary | ICD-10-CM | POA: Diagnosis not present

## 2016-02-03 MED ORDER — DULOXETINE HCL 30 MG PO CPEP: 30.0000 mg | ORAL_CAPSULE | Freq: Every day | ORAL | 0 refills | Status: DC

## 2016-02-03 MED ORDER — GABAPENTIN 300 MG PO CAPS: 300.0000 mg | ORAL_CAPSULE | Freq: Three times a day (TID) | ORAL | 0 refills | Status: DC
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
## 2016-02-03 NOTE — Progress Notes (Signed)
Pre visit review using our clinic review tool, if applicable. No additional management support is needed unless otherwise documented below in the visit note. 

## 2016-02-03 NOTE — Patient Instructions (Addendum)
Someone will call you regarding your orthopedic surgery appointment.  Heat.  Try to keep active as tolerated.

## 2016-02-08 ENCOUNTER — Ambulatory Visit: Payer: BLUE CROSS/BLUE SHIELD | Admitting: Family Medicine

## 2016-04-13 ENCOUNTER — Ambulatory Visit: Payer: BLUE CROSS/BLUE SHIELD | Admitting: Family Medicine

## 2016-04-13 ENCOUNTER — Emergency Department (HOSPITAL_BASED_OUTPATIENT_CLINIC_OR_DEPARTMENT_OTHER)
Admission: EM | Admit: 2016-04-13 | Discharge: 2016-04-14 | Disposition: A | Discharge status: Home / Self Care | Payer: BLUE CROSS/BLUE SHIELD | Attending: Emergency Medicine | Admitting: Emergency Medicine

## 2016-04-13 ENCOUNTER — Encounter (HOSPITAL_BASED_OUTPATIENT_CLINIC_OR_DEPARTMENT_OTHER): Payer: Self-pay | Admitting: Emergency Medicine

## 2016-04-13 DIAGNOSIS — F17200 Nicotine dependence, unspecified, uncomplicated: Secondary | ICD-10-CM | POA: Insufficient documentation

## 2016-04-13 DIAGNOSIS — J209 Acute bronchitis, unspecified: Secondary | ICD-10-CM | POA: Insufficient documentation

## 2016-04-13 MED ORDER — ALBUTEROL SULFATE HFA 108 (90 BASE) MCG/ACT IN AERS
2.0000 | INHALATION_SPRAY | RESPIRATORY_TRACT | Status: DC | PRN
  Administered 2016-04-14: 2 via RESPIRATORY_TRACT
  Filled 2016-04-13: qty 6.7

## 2016-04-13 NOTE — ED Triage Notes (Addendum)
Patient states that she has had a fever, chills and aches for the last 3 days. Patient reports that today it is worse. Patient is sniffing and snorting nasal congestion in triage. Patient reports that she has a fever but does not allow this RN to take a proper temper grabs it and states that she knows how to take a temperature.

## 2016-04-13 NOTE — ED Provider Notes (Signed)
[facility] Provider Note: Taylor Clark Taylor Dudas, MD, FACEP  CSN: [id number] MRN: [id number] ARRIVAL: 04/13/16 at 2106 ROOM: MH07/MH07  By signing my name below, I, Taylor Clark, attest that this documentation has been prepared under the direction and in the presence of Taylor LibraJohn Katelynd Blauvelt, MD. Electronically Signed: Alyssa GroveMartin Clark, ED Scribe. 04/13/16. 11:56 PM.   CHIEF COMPLAINT  Generalized Body Aches  HISTORY OF PRESENT ILLNESS  Taylor Clark is a 36 y.o. female who presents to the Emergency Department complaining of constant generalized body aches for 3-4 days. Pt reports associated productive cough, sore throat, headaches and nausea. Cough is productive of clear, foul-tasting phlegm. She has taken "everything over the counter" without significant relief. Pt reports PMHx of bronchitis, but denies PMHx of asthma. She has never used an inhaler. Pt denies fever and vomiting.  Past Medical History:  Diagnosis Date  . Anemia     Past Surgical History:  Procedure Laterality Date  . HIP SURGERY  2011  . JOINT REPLACEMENT    . WRIST SURGERY      Family History  Problem Relation Age of Onset  . Non-Hodgkin's lymphoma Mother   . HIV/AIDS Father     died at age 37  . Anxiety disorder Sister   . Diabetes Maternal Grandmother   . Hypertension Maternal Grandfather   . Diabetes Paternal Grandmother   . Kidney failure Paternal Grandmother     Social History  Substance Use Topics  . Smoking status: Current Some Day Smoker    Packs/day: 1.00  . Smokeless tobacco: Never Used  . Alcohol use No    Prior to Admission medications   Medication Sig Start Date End Date Taking? Authorizing Provider  diclofenac (VOLTAREN) 75 MG EC tablet Take 1 tablet (75 mg total) by mouth 2 (two) times daily. 01/05/16   Taylor FoundJessica C Copland, MD  DULoxetine (CYMBALTA) 30 MG capsule Take 1 capsule (30 mg total) by mouth daily. 02/03/16   Taylor DoryNicholas Paul Wendling, DO  gabapentin (NEURONTIN) 300 MG capsule  Take 1 capsule (300 mg total) by mouth 3 (three) times daily. 02/03/16   Taylor DoryNicholas Paul Wendling, DO    Allergies Hydrocodone and Morphine sulfate   REVIEW OF SYSTEMS  Negative except as noted here or in the History of Present Illness.   PHYSICAL EXAMINATION  Initial Vital Signs Blood pressure 116/76, pulse 72, temperature 98.7 F (37.1 C), temperature source Oral, resp. rate 18, height 5\' 3"  (1.6 m), weight 185 lb (83.9 kg), last menstrual period 03/28/2016, SpO2 100 %.  Examination General: Well-developed, well-nourished female in no acute distress; appearance consistent with age of record HENT: normocephalic; atraumatic, no pharyngeal erythema or exudate Eyes: pupils equal, round and reactive to light; extraocular muscles intact Neck: supple Heart: regular rate and rhythm Lungs: expiratory wheezing; coughing on deep breaths Abdomen: soft; nondistended; nontender; no masses or hepatosplenomegaly; bowel sounds present Extremities: No deformity; full range of motion; pulses normal Neurologic: Awake, alert and oriented; motor function intact in all extremities and symmetric; no facial droop Skin: Warm and dry Psychiatric: Flat affect   RESULTS  Summary of this visit's results, reviewed by myself:   EKG Interpretation  Date/Time:    Ventricular Rate:    PR Interval:    QRS Duration:   QT Interval:    QTC Calculation:   R Axis:     Text Interpretation:        Laboratory Studies: No results found for this or any previous visit (from the past 24  hour(s)). Imaging Studies: Dg Chest 2 View  Result Date: 04/14/2016 CLINICAL DATA:  Fever chills and myalgias for 3 days. Worsened tonight. EXAM: CHEST  2 VIEW COMPARISON:  12/31/2015 FINDINGS: The heart size and mediastinal contours are within normal limits. Both lungs are clear. The visualized skeletal structures are unremarkable. IMPRESSION: No active cardiopulmonary disease. Electronically Signed   By: Taylor Clark M.D.    On: 04/14/2016 00:24    ED COURSE  Nursing notes and initial vitals signs, including pulse oximetry, reviewed.  Vitals:   04/13/16 2111 04/13/16 2112 04/14/16 0002  BP: 116/76    Pulse: 72    Resp: 18    Temp: 98.7 F (37.1 C)    TempSrc: Oral    SpO2: 100%  99%  Weight:  185 lb (83.9 kg)   Height:  5\' 3"  (1.6 m)     PROCEDURES    ED DIAGNOSES     ICD-9-CM ICD-10-CM   1. Acute bronchitis with bronchospasm 466.0 J20.9     I personally performed the services described in this documentation, which was scribed in my presence. The recorded information has been reviewed and is accurate.    Taylor Libra, MD 04/14/16 [phone]

## 2016-04-13 NOTE — ED Notes (Signed)
ED Provider at bedside. 

## 2016-04-14 ENCOUNTER — Encounter (HOSPITAL_BASED_OUTPATIENT_CLINIC_OR_DEPARTMENT_OTHER): Payer: Self-pay | Admitting: *Deleted

## 2016-04-14 ENCOUNTER — Emergency Department (HOSPITAL_BASED_OUTPATIENT_CLINIC_OR_DEPARTMENT_OTHER): Payer: BLUE CROSS/BLUE SHIELD

## 2016-04-14 ENCOUNTER — Emergency Department (HOSPITAL_BASED_OUTPATIENT_CLINIC_OR_DEPARTMENT_OTHER)
Admission: EM | Admit: 2016-04-14 | Discharge: 2016-04-14 | Disposition: A | Discharge status: Home / Self Care | Payer: BLUE CROSS/BLUE SHIELD | Attending: Emergency Medicine | Admitting: Emergency Medicine

## 2016-04-14 DIAGNOSIS — Y999 Unspecified external cause status: Secondary | ICD-10-CM | POA: Diagnosis not present

## 2016-04-14 DIAGNOSIS — X58XXXA Exposure to other specified factors, initial encounter: Secondary | ICD-10-CM | POA: Diagnosis not present

## 2016-04-14 DIAGNOSIS — R040 Epistaxis: Secondary | ICD-10-CM | POA: Diagnosis not present

## 2016-04-14 DIAGNOSIS — F17200 Nicotine dependence, unspecified, uncomplicated: Secondary | ICD-10-CM | POA: Insufficient documentation

## 2016-04-14 DIAGNOSIS — Y929 Unspecified place or not applicable: Secondary | ICD-10-CM | POA: Insufficient documentation

## 2016-04-14 DIAGNOSIS — S0992XA Unspecified injury of nose, initial encounter: Secondary | ICD-10-CM | POA: Diagnosis present

## 2016-04-14 DIAGNOSIS — Y939 Activity, unspecified: Secondary | ICD-10-CM | POA: Diagnosis not present

## 2016-04-14 DIAGNOSIS — S0031XA Abrasion of nose, initial encounter: Secondary | ICD-10-CM | POA: Diagnosis not present

## 2016-04-14 MED ORDER — OXYMETAZOLINE HCL 0.05 % NA SOLN
1.0000 | Freq: Once | NASAL | Status: AC
  Administered 2016-04-14: 1 via NASAL
  Filled 2016-04-14: qty 15

## 2016-04-14 MED ORDER — ONDANSETRON 8 MG PO TBDP: 8.0000 mg | ORAL_TABLET | Freq: Three times a day (TID) | ORAL | 0 refills | Status: DC | PRN

## 2016-04-14 NOTE — ED Triage Notes (Signed)
Patient states she developed a nose bleed this morning while at work. Which lasted for 30 minutes.  States she has had recent uri over the last week.  Using OTC meds with minimal relief.

## 2016-04-14 NOTE — Discharge Instructions (Signed)
Return if any problems.

## 2016-04-14 NOTE — ED Provider Notes (Signed)
[facility] Provider Note   CSN: [id number] Arrival date & time: 04/14/16  1005     History   Chief Complaint Chief Complaint  Patient presents with  . Epistaxis    HPI Taylor Clark is a 36 y.o. female.  The history is provided by the patient. No language interpreter was used.  Epistaxis   This is a new problem. The current episode started less than 1 hour ago. The problem occurs constantly. The problem has been resolved. The bleeding has been from the right nare. She has tried nothing for the symptoms. The treatment provided no relief. Her past medical history does not include nose-picking or frequent nosebleeds.  Pt complains of bleeding from her right nostril    Past Medical History:  Diagnosis Date  . Anemia     There are no active problems to display for this patient.   Past Surgical History:  Procedure Laterality Date  . HIP SURGERY  2011  . JOINT REPLACEMENT    . WRIST SURGERY      OB History    No data available       Home Medications    Prior to Admission medications   Medication Sig Start Date End Date Taking? Authorizing Provider  DULoxetine (CYMBALTA) 30 MG capsule Take 1 capsule (30 mg total) by mouth daily. 02/03/16  Yes Jilda Roche Wendling, DO  ondansetron (ZOFRAN ODT) 8 MG disintegrating tablet Take 1 tablet (8 mg total) by mouth every 8 (eight) hours as needed for nausea or vomiting. 04/14/16  Yes Paula Libra, MD  diclofenac (VOLTAREN) 75 MG EC tablet Take 1 tablet (75 mg total) by mouth 2 (two) times daily. 01/05/16   Gwenlyn Found Copland, MD  gabapentin (NEURONTIN) 300 MG capsule Take 1 capsule (300 mg total) by mouth 3 (three) times daily. 02/03/16   Sharlene Dory, DO    Family History Family History  Problem Relation Age of Onset  . Non-Hodgkin's lymphoma Mother   . HIV/AIDS Father     died at age 17  . Anxiety disorder Sister   . Diabetes Maternal Grandmother   . Hypertension Maternal Grandfather   .  Diabetes Paternal Grandmother   . Kidney failure Paternal Grandmother     Social History Social History  Substance Use Topics  . Smoking status: Current Some Day Smoker    Packs/day: 1.00  . Smokeless tobacco: Never Used  . Alcohol use No     Allergies   Hydrocodone and Morphine sulfate   Review of Systems Review of Systems  HENT: Positive for nosebleeds.   All other systems reviewed and are negative.    Physical Exam Updated Vital Signs BP 126/75 (BP Location: Left Arm)   Pulse 86   Temp 98.7 F (37.1 C) (Oral)   Resp 18   Ht 5\' 3"  (1.6 m)   Wt 83.9 kg   LMP 03/28/2016   SpO2 98%   BMI 32.77 kg/m   Physical Exam  Constitutional: She is oriented to person, place, and time. She appears well-developed and well-nourished.  HENT:  Head: Normocephalic.  Swelling right nasal mucosa, abrasion  No active bleeding  Eyes: EOM are normal. Pupils are equal, round, and reactive to light.  Cardiovascular: Normal rate.   Pulmonary/Chest: Effort normal.  Musculoskeletal: Normal range of motion.  Neurological: She is alert and oriented to person, place, and time.  Skin: Skin is warm.  Psychiatric: She has a normal mood and affect.  Nursing note and vitals  reviewed.    ED Treatments / Results  Labs (all labs ordered are listed, but only abnormal results are displayed) Labs Reviewed - No data to display  EKG  EKG Interpretation None       Radiology Dg Chest 2 View  Result Date: 04/14/2016 CLINICAL DATA:  Fever chills and myalgias for 3 days. Worsened tonight. EXAM: CHEST  2 VIEW COMPARISON:  12/31/2015 FINDINGS: The heart size and mediastinal contours are within normal limits. Both lungs are clear. The visualized skeletal structures are unremarkable. IMPRESSION: No active cardiopulmonary disease. Electronically Signed   By: Ellery Plunkaniel R Mitchell M.D.   On: 04/14/2016 00:24    Procedures Procedures (including critical care time)  Medications Ordered in  ED Medications  oxymetazoline (AFRIN) 0.05 % nasal spray 1 spray (1 spray Each Nare Given 04/14/16 1213)     Initial Impression / Assessment and Plan / ED Course  I have reviewed the triage vital signs and the nursing notes.  Pertinent labs & imaging results that were available during my care of the patient were reviewed by me and considered in my medical decision making (see chart for details).       Final Clinical Impressions(s) / ED Diagnoses   Final diagnoses:  Epistaxis    New Prescriptions Discharge Medication List as of 04/14/2016 12:00 PM    An After Visit Summary was printed and given to the patient.   Lonia SkinnerLeslie K RossmoorSofia, PA-C 04/14/16 1626    Pricilla LovelessScott Goldston, MD 04/15/16 [phone]

## 2016-04-14 NOTE — ED Triage Notes (Signed)
[facility] report the patient developed a nose bleed approximately 50 minutes pta.  States nose bleed was controlled at home.  States patient has not felt well for the last 3 days, [facility] a [facility] and received an inhaler.  VS: B/P 126 78; HR 90, RR 16.

## 2016-04-17 ENCOUNTER — Encounter: Payer: Self-pay | Admitting: Family Medicine

## 2016-06-26 ENCOUNTER — Encounter (HOSPITAL_BASED_OUTPATIENT_CLINIC_OR_DEPARTMENT_OTHER): Payer: Self-pay | Admitting: Emergency Medicine

## 2016-06-26 ENCOUNTER — Emergency Department (HOSPITAL_BASED_OUTPATIENT_CLINIC_OR_DEPARTMENT_OTHER): Payer: BLUE CROSS/BLUE SHIELD

## 2016-06-26 ENCOUNTER — Emergency Department (HOSPITAL_BASED_OUTPATIENT_CLINIC_OR_DEPARTMENT_OTHER)
Admission: EM | Admit: 2016-06-26 | Discharge: 2016-06-27 | Disposition: A | Discharge status: Home / Self Care | Payer: BLUE CROSS/BLUE SHIELD | Attending: Emergency Medicine | Admitting: Emergency Medicine

## 2016-06-26 DIAGNOSIS — Y929 Unspecified place or not applicable: Secondary | ICD-10-CM | POA: Insufficient documentation

## 2016-06-26 DIAGNOSIS — W500XXA Accidental hit or strike by another person, initial encounter: Secondary | ICD-10-CM | POA: Insufficient documentation

## 2016-06-26 DIAGNOSIS — R1110 Vomiting, unspecified: Secondary | ICD-10-CM | POA: Insufficient documentation

## 2016-06-26 DIAGNOSIS — M79675 Pain in left toe(s): Secondary | ICD-10-CM | POA: Insufficient documentation

## 2016-06-26 DIAGNOSIS — Y9389 Activity, other specified: Secondary | ICD-10-CM | POA: Insufficient documentation

## 2016-06-26 DIAGNOSIS — B9789 Other viral agents as the cause of diseases classified elsewhere: Secondary | ICD-10-CM

## 2016-06-26 DIAGNOSIS — Y999 Unspecified external cause status: Secondary | ICD-10-CM | POA: Insufficient documentation

## 2016-06-26 DIAGNOSIS — J069 Acute upper respiratory infection, unspecified: Secondary | ICD-10-CM | POA: Insufficient documentation

## 2016-06-26 DIAGNOSIS — F17200 Nicotine dependence, unspecified, uncomplicated: Secondary | ICD-10-CM | POA: Insufficient documentation

## 2016-06-26 NOTE — ED Provider Notes (Signed)
[facility] Provider Note   CSN: [id number] Arrival date & time: 06/26/16  2129  By signing my name below, I, Deland PrettySherilynn Knight, attest that this documentation has been prepared under the direction and in the presence of [place] Russo, [place] Electronically Signed: Deland PrettySherilynn Knight, ED Scribe. 06/26/16. 11:54 PM.  History   Chief Complaint Chief Complaint  Patient presents with  . Foot Injury   The history is provided by the patient. No language interpreter was used.     HPI Comments: Taylor Clark is a 36 y.o. female who presents to the Emergency Department complaining of left fourth toe pain after playing with her children and accidentally hitting it against her child's knee. Moving her toe and walking exacerbates her pain. She reports that she has not taken OTC medications to treat her symptoms and has not applied a cool compress. Denies numbness and tingling, or previous injury to toe.  The pt additionally reports of cold symptoms that also began a week ago: including an associated non-productive cough, sore throat, vomiting (after coughing) and nasal congestion. The pt attests that she took Nyquil, Dayquil, and Musinex with no relief. She states the her daughter is a possible positive contact. She denies abdominal pain, difficulty swallowing or breathing, chest pain, shortness of breath, or fever.  The pt denies a PMHx of PUD or GI bleed.  Past Medical History:  Diagnosis Date  . Anemia     There are no active problems to display for this patient.   Past Surgical History:  Procedure Laterality Date  . HIP SURGERY  2011  . JOINT REPLACEMENT    . WRIST SURGERY      OB History    No data available       Home Medications    Prior to Admission medications   Medication Sig Start Date End Date Taking? Authorizing Provider  diclofenac (VOLTAREN) 75 MG EC tablet Take 1 tablet (75 mg total) by mouth 2 (two) times daily. 01/05/16   Copland, Gwenlyn FoundJessica C, MD    DULoxetine (CYMBALTA) 30 MG capsule Take 1 capsule (30 mg total) by mouth daily. 02/03/16   Sharlene DoryWendling, Nicholas Paul, DO  gabapentin (NEURONTIN) 300 MG capsule Take 1 capsule (300 mg total) by mouth 3 (three) times daily. 02/03/16   Sharlene DoryWendling, Nicholas Paul, DO  ondansetron (ZOFRAN ODT) 8 MG disintegrating tablet Take 1 tablet (8 mg total) by mouth every 8 (eight) hours as needed for nausea or vomiting. 04/14/16   Molpus, Jonny RuizJohn, MD    Family History Family History  Problem Relation Age of Onset  . Non-Hodgkin's lymphoma Mother   . HIV/AIDS Father        died at age 37  . Anxiety disorder Sister   . Diabetes Maternal Grandmother   . Hypertension Maternal Grandfather   . Diabetes Paternal Grandmother   . Kidney failure Paternal Grandmother     Social History Social History  Substance Use Topics  . Smoking status: Current Some Day Smoker    Packs/day: 1.00  . Smokeless tobacco: Never Used  . Alcohol use No     Allergies   Hydrocodone and Morphine sulfate   Review of Systems Review of Systems  Constitutional: Negative for fever.  HENT: Positive for congestion and sore throat. Negative for trouble swallowing.   Respiratory: Positive for cough. Negative for shortness of breath.   Cardiovascular: Negative for chest pain.  Gastrointestinal: Positive for vomiting. Negative for abdominal pain.  Musculoskeletal: Positive for arthralgias and myalgias.  Neurological: Negative for numbness.     Physical Exam Updated Vital Signs BP 113/63 (BP Location: Right Arm)   Pulse 83   Temp 98.7 F (37.1 C) (Oral)   Resp 18   Ht 5\' 3"  (1.6 m)   Wt 77.1 kg (170 lb)   SpO2 96%   BMI 30.11 kg/m   Physical Exam  Constitutional: She appears well-developed and well-nourished. No distress.  Well-appearing  HENT:  Head: Normocephalic and atraumatic.  Left Ear: Tympanic membrane, external ear and ear canal normal.  Nose: Nose normal.  Mouth/Throat: Uvula is midline, oropharynx is clear  and moist and mucous membranes are normal. No trismus in the jaw. No uvula swelling. No posterior oropharyngeal edema or posterior oropharyngeal erythema. No tonsillar exudate.  Right ear with cerumen impaction, TM not visualized  Eyes: Conjunctivae are normal.  Neck: Normal range of motion. Neck supple.  Cardiovascular: Normal rate, regular rhythm, normal heart sounds and intact distal pulses.  Exam reveals no gallop and no friction rub.   No murmur heard. Pulmonary/Chest: Effort normal and breath sounds normal. No respiratory distress. She has no wheezes. She has no rales. She exhibits no tenderness.  Abdominal: Soft. Bowel sounds are normal. She exhibits no distension. There is no tenderness. There is no rebound and no guarding.  Musculoskeletal:  Left fourth toe with mild edema, no ecchymosis or deformity. Normal ROM though with pain  Lymphadenopathy:    She has no cervical adenopathy.  Neurological: She is alert. No sensory deficit.  Psychiatric: She has a normal mood and affect. Her behavior is normal.  Nursing note and vitals reviewed.    ED Treatments / Results   DIAGNOSTIC STUDIES: Oxygen Saturation is 96% on RA, adequate by my interpretation.   COORDINATION OF CARE: 11:54 PM-Discussed next steps with pt. Pt verbalized understanding and is agreeable with the plan.   Labs (all labs ordered are listed, but only abnormal results are displayed) Labs Reviewed - No data to display  EKG  EKG Interpretation None       Radiology Dg Foot Complete Left  Result Date: 06/26/2016 CLINICAL DATA:  Accidentally kicked an object, with injury to the left fourth and fifth metatarsals. Left foot pain. Initial encounter. EXAM: LEFT FOOT - COMPLETE 3+ VIEW COMPARISON:  None. FINDINGS: There is no evidence of fracture or dislocation. The joint spaces are preserved. There is no evidence of talar subluxation; the subtalar joint is unremarkable in appearance. No significant soft tissue  abnormalities are seen. IMPRESSION: No evidence of fracture or dislocation. Electronically Signed   By: Roanna Raider M.D.   On: 06/26/2016 22:16    Procedures Procedures (including critical care time)  Medications Ordered in ED Medications - No data to display   Initial Impression / Assessment and Plan / ED Course  I have reviewed the triage vital signs and the nursing notes.  Pertinent labs & imaging results that were available during my care of the patient were reviewed by me and considered in my medical decision making (see chart for details).     Patient with left fourth toe pain and viral URI. X-ray foot negative for acute fracture or dislocation. Left foot and toe NV intact. Patient well-appearing, without uvula swelling or trismus, lungs CTAB, afebrile. Patient safe for discharge with symptomatic management. PCP follow-up as needed.  Discussed results, findings, treatment and follow up. Patient advised of return precautions. Patient verbalized understanding and agreed with plan.   Final Clinical Impressions(s) / ED Diagnoses  Final diagnoses:  Toe pain, left  Viral URI with cough    New Prescriptions Discharge Medication List as of 06/26/2016 11:50 PM     I personally performed the services described in this documentation, which was scribed in my presence. The recorded information has been reviewed and is accurate.      Russo, [place] N, PA-C 06/27/16 0108    Charlynne Pander, MD 06/27/16 [phone]

## 2016-06-26 NOTE — ED Triage Notes (Signed)
Pt c/o left foot pain after accidentally kicking her child in the knee. Pt also c/o non-productive cough x 1 week.

## 2016-06-26 NOTE — Discharge Instructions (Signed)
Please read instructions below.  You can take tylenol or advil as needed for sore throat and toe pain.  Drink plenty of water.  Elevate your foot when possible. Apply ice to your toe for 15 minutes at a time. Follow up with your primary care provider as needed.  Return to the ER for difficulty swallowing liquids, difficulty breathing, or new or worsening symptoms.

## 2016-06-27 NOTE — ED Notes (Signed)
Pt verbalizes understanding of d/c instructions and denies any further needs at this time. 

## 2017-01-28 ENCOUNTER — Emergency Department (HOSPITAL_BASED_OUTPATIENT_CLINIC_OR_DEPARTMENT_OTHER)
Admission: EM | Admit: 2017-01-28 | Discharge: 2017-01-28 | Disposition: A | Discharge status: Home / Self Care | Payer: Self-pay | Attending: Emergency Medicine | Admitting: Emergency Medicine

## 2017-01-28 ENCOUNTER — Other Ambulatory Visit: Payer: Self-pay

## 2017-01-28 ENCOUNTER — Encounter (HOSPITAL_BASED_OUTPATIENT_CLINIC_OR_DEPARTMENT_OTHER): Payer: Self-pay | Admitting: Adult Health

## 2017-01-28 DIAGNOSIS — J069 Acute upper respiratory infection, unspecified: Secondary | ICD-10-CM

## 2017-01-28 DIAGNOSIS — F17210 Nicotine dependence, cigarettes, uncomplicated: Secondary | ICD-10-CM | POA: Insufficient documentation

## 2017-01-28 DIAGNOSIS — J111 Influenza due to unidentified influenza virus with other respiratory manifestations: Secondary | ICD-10-CM | POA: Insufficient documentation

## 2017-01-28 DIAGNOSIS — R69 Illness, unspecified: Secondary | ICD-10-CM

## 2017-01-28 MED ORDER — ONDANSETRON 4 MG PO TBDP: 4.0000 mg | ORAL_TABLET | Freq: Three times a day (TID) | ORAL | 0 refills | Status: DC | PRN

## 2017-01-28 MED ORDER — KETOROLAC TROMETHAMINE 30 MG/ML IJ SOLN
30.0000 mg | Freq: Once | INTRAMUSCULAR | Status: AC
  Administered 2017-01-28: 30 mg via INTRAMUSCULAR
  Filled 2017-01-28: qty 1

## 2017-01-28 MED ORDER — FLUTICASONE PROPIONATE 50 MCG/ACT NA SUSP: 1.0000 | Freq: Every day | NASAL | 2 refills | Status: DC

## 2017-01-28 MED ORDER — NAPROXEN 500 MG PO TABS: 500.0000 mg | ORAL_TABLET | Freq: Two times a day (BID) | ORAL | 0 refills | Status: DC

## 2017-01-28 MED ORDER — BENZONATATE 200 MG PO CAPS: 200.0000 mg | ORAL_CAPSULE | Freq: Three times a day (TID) | ORAL | 0 refills | Status: DC | PRN

## 2017-01-28 NOTE — ED Triage Notes (Addendum)
Presents with 2 days of cough, congestion, ear pain, , body aches, headache and generalized fatigue. She took cough medicine and tylenol without relief. ALl three of her children have been sick with similair symptoms.

## 2017-01-28 NOTE — Discharge Instructions (Signed)
Please read attached information regarding your condition. Take Tessalon Perles as needed for cough.  Take Flonase as needed for nasal congestion.  Take Zofran as needed for nausea. Take naproxen as needed for fevers and body aches. Follow-up with your primary care provider for further evaluation. Return to ED for worse symptoms, chest pain, shortness of breath, coughing up blood, increased vomiting, lightheadedness or loss of consciousness.

## 2017-01-28 NOTE — ED Provider Notes (Signed)
[facility] EMERGENCY DEPARTMENT Provider Note   CSN: [id number] Arrival date & time: 01/28/17  1611     History   Chief Complaint Chief Complaint  Patient presents with  . URI    HPI Taylor AbrahamCassandra L Derrell Lollingngram is a 36 y.o. female who presents to ED for evaluation of 3-day history of cough, headache, nasal congestion, body aches, nausea and left-sided ear pain.  She states that she took DayQuil, and other over-the-counter congestion medication and Tylenol without much improvement of symptoms.  States that 3 of her children had similar symptoms over the past several weeks.  She reports a dry cough.  She did not receive her influenza vaccine this year.  She denies any chest pain, hemoptysis, wheezing, trouble breathing, trouble swallowing, sore throat, fevers, head injuries.  HPI  Past Medical History:  Diagnosis Date  . Anemia     There are no active problems to display for this patient.   Past Surgical History:  Procedure Laterality Date  . HIP SURGERY  2011  . JOINT REPLACEMENT    . WRIST SURGERY      OB History    No data available       Home Medications    Prior to Admission medications   Medication Sig Start Date End Date Taking? Authorizing Provider  acetaminophen (TYLENOL) 325 MG tablet Take 650 mg by mouth every 6 (six) hours as needed.   Yes [provider]  benzonatate (TESSALON) 200 MG capsule Take 1 capsule (200 mg total) by mouth 3 (three) times daily as needed for cough. 01/28/17   Josceline Chenard, PA-C  diclofenac (VOLTAREN) 75 MG EC tablet Take 1 tablet (75 mg total) by mouth 2 (two) times daily. 01/05/16   Copland, Gwenlyn FoundJessica C, MD  DULoxetine (CYMBALTA) 30 MG capsule Take 1 capsule (30 mg total) by mouth daily. 02/03/16   Sharlene DoryWendling, Nicholas Paul, DO  fluticasone (FLONASE) 50 MCG/ACT nasal spray Place 1 spray into both nostrils daily. 01/28/17   Delos Klich, PA-C  gabapentin (NEURONTIN) 300 MG capsule Take 1 capsule (300 mg total) by mouth 3  (three) times daily. 02/03/16   Sharlene DoryWendling, Nicholas Paul, DO  naproxen (NAPROSYN) 500 MG tablet Take 1 tablet (500 mg total) by mouth 2 (two) times daily. 01/28/17   Denisa Enterline, PA-C  ondansetron (ZOFRAN ODT) 4 MG disintegrating tablet Take 1 tablet (4 mg total) by mouth every 8 (eight) hours as needed for nausea or vomiting. 01/28/17   Dietrich PatesKhatri, Onna Nodal, PA-C    Family History Family History  Problem Relation Age of Onset  . Non-Hodgkin's lymphoma Mother   . HIV/AIDS Father        died at age 631  . Anxiety disorder Sister   . Diabetes Maternal Grandmother   . Hypertension Maternal Grandfather   . Diabetes Paternal Grandmother   . Kidney failure Paternal Grandmother     Social History Social History   Tobacco Use  . Smoking status: Current Some Day Smoker    Packs/day: 1.00  . Smokeless tobacco: Never Used  Substance Use Topics  . Alcohol use: No  . Drug use: No     Allergies   Hydrocodone and Morphine sulfate   Review of Systems Review of Systems  Constitutional: Positive for appetite change. Negative for chills and fever.  HENT: Positive for congestion, ear pain, postnasal drip, rhinorrhea and sneezing. Negative for drooling, ear discharge, nosebleeds, sore throat, trouble swallowing and voice change.   Respiratory: Positive for cough. Negative for shortness  of breath.   Cardiovascular: Negative for chest pain.  Gastrointestinal: Positive for nausea. Negative for abdominal pain and vomiting.  Genitourinary: Negative for dysuria.  Musculoskeletal: Positive for myalgias. Negative for neck pain and neck stiffness.  Skin: Negative for rash.     Physical Exam Updated Vital Signs BP 121/79   Pulse 84   Temp 98.6 F (37 C) (Oral)   Resp 18   Wt 77.1 kg (170 lb)   SpO2 96%   BMI 30.11 kg/m   Physical Exam  Constitutional: She appears well-developed and well-nourished. No distress.  Nontoxic appearing and in no acute distress.  HENT:  Head: Normocephalic and  atraumatic.  Right Ear: Tympanic membrane is not erythematous, not retracted and not bulging. A middle ear effusion is present.  Left Ear: Tympanic membrane is not erythematous, not retracted and not bulging. A middle ear effusion is present.  Nose: Rhinorrhea present.  Mouth/Throat: Uvula is midline. No posterior oropharyngeal edema or posterior oropharyngeal erythema. No tonsillar exudate.  Eyes: Conjunctivae and EOM are normal. No scleral icterus.  Neck: Normal range of motion.  Cardiovascular: Normal rate, regular rhythm and normal heart sounds.  Pulmonary/Chest: Effort normal and breath sounds normal. No respiratory distress.  Neurological: She is alert.  Skin: No rash noted. She is not diaphoretic.  Psychiatric: She has a normal mood and affect.  Nursing note and vitals reviewed.    ED Treatments / Results  Labs (all labs ordered are listed, but only abnormal results are displayed) Labs Reviewed - No data to display  EKG  EKG Interpretation None       Radiology No results found.  Procedures Procedures (including critical care time)  Medications Ordered in ED Medications  ketorolac (TORADOL) 30 MG/ML injection 30 mg (not administered)     Initial Impression / Assessment and Plan / ED Course  I have reviewed the triage vital signs and the nursing notes.  Pertinent labs & imaging results that were available during my care of the patient were reviewed by me and considered in my medical decision making (see chart for details).     Patient presents to ED for evaluation of 3-day history of cough, headache, nasal congestion, body aches and nausea as well as left-sided ear pain.  Did not receive her influenza vaccine this year.  Sick contacts with 3 children with similar symptoms in the past several weeks.  She is otherwise healthy with no other medical issues or daily medication use.  On physical exam she is overall well-appearing.  She is afebrile here.  Satting at  96-98% on room air.  She is not tachycardic or tachypneic.  She does have some TM congestion and nasal congestion noted.  Lungs are clear to auscultation bilaterally.  I have low suspicion for pneumonia or other severe infectious process for her symptoms.  I suspect influenza-like illness.  Will give symptomatic treatment and advised her to follow-up with primary care provider for further evaluation.  Given Toradol here for body aches and headache.  Patient appears stable for discharge at this time.  Strict return precautions given.  Final Clinical Impressions(s) / ED Diagnoses   Final diagnoses:  Influenza-like illness  Viral upper respiratory tract infection    ED Discharge Orders        Ordered    benzonatate (TESSALON) 200 MG capsule  3 times daily PRN     01/28/17 1818    fluticasone (FLONASE) 50 MCG/ACT nasal spray  Daily     01/28/17 1818  ondansetron (ZOFRAN ODT) 4 MG disintegrating tablet  Every 8 hours PRN     01/28/17 1818    naproxen (NAPROSYN) 500 MG tablet  2 times daily     01/28/17 1818     Portions of this note were generated with Dragon dictation software. Dictation errors may occur despite best attempts at proofreading.    Dietrich Pates, PA-C 01/28/17 1823    Maia Plan, MD 01/29/17 1212

## 2017-05-12 ENCOUNTER — Other Ambulatory Visit: Payer: Self-pay

## 2017-05-12 ENCOUNTER — Emergency Department (HOSPITAL_BASED_OUTPATIENT_CLINIC_OR_DEPARTMENT_OTHER)
Admission: EM | Admit: 2017-05-12 | Discharge: 2017-05-13 | Disposition: A | Discharge status: Home / Self Care | Payer: Self-pay | Attending: Emergency Medicine | Admitting: Emergency Medicine

## 2017-05-12 ENCOUNTER — Encounter (HOSPITAL_BASED_OUTPATIENT_CLINIC_OR_DEPARTMENT_OTHER): Payer: Self-pay | Admitting: *Deleted

## 2017-05-12 DIAGNOSIS — R1110 Vomiting, unspecified: Secondary | ICD-10-CM | POA: Insufficient documentation

## 2017-05-12 DIAGNOSIS — R040 Epistaxis: Secondary | ICD-10-CM | POA: Insufficient documentation

## 2017-05-12 DIAGNOSIS — R0789 Other chest pain: Secondary | ICD-10-CM | POA: Insufficient documentation

## 2017-05-12 DIAGNOSIS — F17210 Nicotine dependence, cigarettes, uncomplicated: Secondary | ICD-10-CM | POA: Insufficient documentation

## 2017-05-12 DIAGNOSIS — B9789 Other viral agents as the cause of diseases classified elsewhere: Secondary | ICD-10-CM | POA: Insufficient documentation

## 2017-05-12 DIAGNOSIS — Z79899 Other long term (current) drug therapy: Secondary | ICD-10-CM | POA: Insufficient documentation

## 2017-05-12 DIAGNOSIS — R070 Pain in throat: Secondary | ICD-10-CM | POA: Insufficient documentation

## 2017-05-12 DIAGNOSIS — R05 Cough: Secondary | ICD-10-CM | POA: Insufficient documentation

## 2017-05-12 DIAGNOSIS — R0602 Shortness of breath: Secondary | ICD-10-CM | POA: Insufficient documentation

## 2017-05-12 DIAGNOSIS — J069 Acute upper respiratory infection, unspecified: Secondary | ICD-10-CM | POA: Insufficient documentation

## 2017-05-12 NOTE — ED Triage Notes (Signed)
Pt c/o 2 days sore throat, cough, body aches, chills, chest and back pain, states "everytime I smell food I throw up"

## 2017-05-13 ENCOUNTER — Emergency Department (HOSPITAL_BASED_OUTPATIENT_CLINIC_OR_DEPARTMENT_OTHER): Payer: Self-pay

## 2017-05-13 ENCOUNTER — Other Ambulatory Visit: Payer: Self-pay

## 2017-05-13 LAB — URINALYSIS, ROUTINE W REFLEX MICROSCOPIC
Bilirubin Urine: NEGATIVE
GLUCOSE, UA: NEGATIVE mg/dL
Hgb urine dipstick: NEGATIVE
Ketones, ur: NEGATIVE mg/dL
LEUKOCYTES UA: NEGATIVE
Nitrite: NEGATIVE
PROTEIN: NEGATIVE mg/dL
Specific Gravity, Urine: 1.02 (ref 1.005–1.030)
pH: 6.5 (ref 5.0–8.0)

## 2017-05-13 LAB — PREGNANCY, URINE: Preg Test, Ur: NEGATIVE

## 2017-05-13 MED ORDER — ALBUTEROL SULFATE HFA 108 (90 BASE) MCG/ACT IN AERS: 2.0000 | INHALATION_SPRAY | RESPIRATORY_TRACT | 0 refills | Status: DC | PRN

## 2017-05-13 MED ORDER — IBUPROFEN 800 MG PO TABS
800.0000 mg | ORAL_TABLET | Freq: Once | ORAL | Status: AC
  Administered 2017-05-13: 800 mg via ORAL
  Filled 2017-05-13: qty 1

## 2017-05-13 MED ORDER — LORATADINE 10 MG PO TABS: 10.0000 mg | ORAL_TABLET | Freq: Every day | ORAL | 0 refills | Status: DC

## 2017-05-13 MED ORDER — ALBUTEROL SULFATE HFA 108 (90 BASE) MCG/ACT IN AERS
INHALATION_SPRAY | RESPIRATORY_TRACT | Status: AC
  Filled 2017-05-13: qty 6.7

## 2017-05-13 MED ORDER — OXYMETAZOLINE HCL 0.05 % NA SOLN
1.0000 | Freq: Once | NASAL | Status: AC
  Administered 2017-05-13: 1 via NASAL
  Filled 2017-05-13: qty 15

## 2017-05-13 MED ORDER — PREDNISONE 20 MG PO TABS: 40.0000 mg | ORAL_TABLET | Freq: Every day | ORAL | 0 refills | Status: DC

## 2017-05-13 MED ORDER — ALBUTEROL SULFATE HFA 108 (90 BASE) MCG/ACT IN AERS
2.0000 | INHALATION_SPRAY | Freq: Once | RESPIRATORY_TRACT | Status: AC
  Administered 2017-05-13: 2 via RESPIRATORY_TRACT

## 2017-05-13 MED ORDER — ONDANSETRON 4 MG PO TBDP: 4.0000 mg | ORAL_TABLET | Freq: Three times a day (TID) | ORAL | 0 refills | Status: DC | PRN

## 2017-05-13 NOTE — Discharge Instructions (Addendum)
You were seen today for upper respiratory symptoms.  Given your history of smoking, take inhaler and prednisone for your cough.  Zofran as needed for vomiting.  This is likely viral and/or allergic in nature.  Make sure to stay hydrated.

## 2017-05-13 NOTE — ED Provider Notes (Signed)
[facility] EMERGENCY DEPARTMENT Provider Note   CSN: [id number] Arrival date & time: 05/12/17  2252     History   Chief Complaint Chief Complaint  Patient presents with  . Generalized Body Aches    HPI Serita ShellerCassandra L Sider is a 37 y.o. female.  HPI  This is a 37 year old female who presents with 2-day history of myalgia, chills, chest tightness, cough, sore throat, vomiting.  Patient reports her son has pneumonia and my daughter has "allergies."  Patient reports a nonproductive cough with shortness of breath and chest tightening.  Also reports sore throat bilateral ear discomfort.  She states that she has had several episodes of nonbilious, nonbloody emesisr.  Denies abdominal pain or diarrhea.  No documented temperatures at home.  Patient has taken over-the-counter medication without relief.  She is a current smoker.  Past Medical History:  Diagnosis Date  . Anemia     There are no active problems to display for this patient.   Past Surgical History:  Procedure Laterality Date  . CESAREAN SECTION    . HIP SURGERY  2011  . JOINT REPLACEMENT    . WRIST SURGERY       OB History   None      Home Medications    Prior to Admission medications   Medication Sig Start Date End Date Taking? Authorizing Provider  acetaminophen (TYLENOL) 325 MG tablet Take 650 mg by mouth every 6 (six) hours as needed.    [provider]  albuterol (PROVENTIL HFA;VENTOLIN HFA) 108 (90 Base) MCG/ACT inhaler Inhale 2 puffs into the lungs every 4 (four) hours as needed for wheezing or shortness of breath. 05/13/17   Kindall Swaby, Mayer Maskerourtney F, MD  benzonatate (TESSALON) 200 MG capsule Take 1 capsule (200 mg total) by mouth 3 (three) times daily as needed for cough. 01/28/17   Khatri, Hina, PA-C  diclofenac (VOLTAREN) 75 MG EC tablet Take 1 tablet (75 mg total) by mouth 2 (two) times daily. 01/05/16   Copland, Gwenlyn FoundJessica C, MD  DULoxetine (CYMBALTA) 30 MG capsule Take 1 capsule (30 mg total)  by mouth daily. 02/03/16   Sharlene DoryWendling, Nicholas Paul, DO  fluticasone (FLONASE) 50 MCG/ACT nasal spray Place 1 spray into both nostrils daily. 01/28/17   Khatri, Hina, PA-C  gabapentin (NEURONTIN) 300 MG capsule Take 1 capsule (300 mg total) by mouth 3 (three) times daily. 02/03/16   Sharlene DoryWendling, Nicholas Paul, DO  loratadine (CLARITIN) 10 MG tablet Take 1 tablet (10 mg total) by mouth daily. 05/13/17   Yi Falletta, Mayer Maskerourtney F, MD  naproxen (NAPROSYN) 500 MG tablet Take 1 tablet (500 mg total) by mouth 2 (two) times daily. 01/28/17   Khatri, Hina, PA-C  ondansetron (ZOFRAN ODT) 4 MG disintegrating tablet Take 1 tablet (4 mg total) by mouth every 8 (eight) hours as needed for nausea or vomiting. 01/28/17   Khatri, Hina, PA-C  ondansetron (ZOFRAN ODT) 4 MG disintegrating tablet Take 1 tablet (4 mg total) by mouth every 8 (eight) hours as needed for nausea or vomiting. 05/13/17   Jimmylee Ratterree, Mayer Maskerourtney F, MD  predniSONE (DELTASONE) 20 MG tablet Take 2 tablets (40 mg total) by mouth daily. 05/13/17   Aubrianna Orchard, Mayer Maskerourtney F, MD    Family History Family History  Problem Relation Age of Onset  . Non-Hodgkin's lymphoma Mother   . HIV/AIDS Father        died at age 38  . Anxiety disorder Sister   . Diabetes Maternal Grandmother   . Hypertension Maternal Grandfather   .  Diabetes Paternal Grandmother   . Kidney failure Paternal Grandmother     Social History Social History   Tobacco Use  . Smoking status: Current Some Day Smoker    Packs/day: 1.00    Types: Cigarettes  . Smokeless tobacco: Never Used  Substance Use Topics  . Alcohol use: No  . Drug use: No     Allergies   Hydrocodone and Morphine sulfate   Review of Systems Review of Systems  Constitutional: Positive for chills. Negative for fever.  HENT: Positive for congestion and sore throat.   Eyes: Positive for discharge and itching.  Respiratory: Positive for cough and chest tightness. Negative for shortness of breath.   Cardiovascular: Negative for  chest pain and leg swelling.  Gastrointestinal: Positive for nausea and vomiting. Negative for abdominal pain and diarrhea.  Genitourinary: Negative for dysuria.  Musculoskeletal: Negative for neck pain.  Neurological: Positive for headaches.  All other systems reviewed and are negative.    Physical Exam Updated Vital Signs BP 110/77 (BP Location: Left Arm)   Pulse 74   Temp 98.6 F (37 C) (Oral)   Resp 18   Ht 5' (1.524 m)   LMP 04/12/2017   SpO2 97%   BMI 33.20 kg/m   Physical Exam  Constitutional: She is oriented to person, place, and time. She appears well-developed and well-nourished.  HENT:  Head: Normocephalic and atraumatic.  Right Ear: External ear normal.  Left Ear: External ear normal.  Oropharynx moist and clear, uvula midline, no tonsillar exudate  Eyes: Pupils are equal, round, and reactive to light.  Watery drainage bilateral eyes, normal conjunctiva  Neck: Normal range of motion. Neck supple.  No meningismus  Cardiovascular: Normal rate, regular rhythm and normal heart sounds.  Pulmonary/Chest: Effort normal. No respiratory distress. She has wheezes.  Scattered expiratory wheezing  Abdominal: Soft. Bowel sounds are normal. There is no tenderness. There is no rebound.  Neurological: She is alert and oriented to person, place, and time.  Skin: Skin is warm and dry.  Psychiatric: She has a normal mood and affect.  Nursing note and vitals reviewed.    ED Treatments / Results  Labs (all labs ordered are listed, but only abnormal results are displayed) Labs Reviewed  URINALYSIS, ROUTINE W REFLEX MICROSCOPIC - Abnormal; Notable for the following components:      Result Value   APPearance CLOUDY (*)    All other components within normal limits  PREGNANCY, URINE    EKG None  Radiology Dg Chest 2 View  Result Date: 05/13/2017 CLINICAL DATA:  Sore throat, cough and body ache. EXAM: CHEST - 2 VIEW COMPARISON:  04/14/2016 FINDINGS: The heart size and  mediastinal contours are within normal limits. Both lungs are clear. The visualized skeletal structures are unremarkable. IMPRESSION: No active cardiopulmonary disease. Electronically Signed   By: Tollie Eth M.D.   On: 05/13/2017 01:23    Procedures Procedures (including critical care time)  Medications Ordered in ED Medications  albuterol (PROVENTIL HFA;VENTOLIN HFA) 108 (90 Base) MCG/ACT inhaler 2 puff (2 puffs Inhalation Given 05/13/17 0104)  oxymetazoline (AFRIN) 0.05 % nasal spray 1 spray (1 spray Each Nare Given 05/13/17 0110)     Initial Impression / Assessment and Plan / ED Course  I have reviewed the triage vital signs and the nursing notes.  Pertinent labs & imaging results that were available during my care of the patient were reviewed by me and considered in my medical decision making (see chart for details).  Patient presents with upper respiratory symptoms and body aches.  She is nontoxic appearing.  Vital signs are reassuring.  She is afebrile.  Some elements of her history and physical exam are suggestive of an allergic component given her watery eyes and congestion.  However, she is also wheezing on exam.  Patient was given an albuterol inhaler.  Chest x-ray shows no evidence of pneumonia.  Suspect viral etiology versus allergic etiology.  Will start on Claritin daily.  Given history of smoking, will give prednisone and an inhaler.  Otherwise recommend supportive measures including ibuprofen and nasal saline.  After history, exam, and medical workup I feel the patient has been appropriately medically screened and is safe for discharge home. Pertinent diagnoses were discussed with the patient. Patient was given return precautions.   Final Clinical Impressions(s) / ED Diagnoses   Final diagnoses:  Epistaxis  Viral URI with cough    ED Discharge Orders        Ordered    loratadine (CLARITIN) 10 MG tablet  Daily     05/13/17 0136    albuterol (PROVENTIL HFA;VENTOLIN  HFA) 108 (90 Base) MCG/ACT inhaler  Every 4 hours PRN     05/13/17 0136    predniSONE (DELTASONE) 20 MG tablet  Daily     05/13/17 0136    ondansetron (ZOFRAN ODT) 4 MG disintegrating tablet  Every 8 hours PRN     05/13/17 0136       Shon Baton, MD 05/13/17 [phone]

## 2017-05-13 NOTE — ED Notes (Signed)
Pt to bathroom to give urine sample

## 2017-07-25 ENCOUNTER — Other Ambulatory Visit: Payer: Self-pay

## 2017-07-25 ENCOUNTER — Encounter (HOSPITAL_BASED_OUTPATIENT_CLINIC_OR_DEPARTMENT_OTHER): Payer: Self-pay | Admitting: *Deleted

## 2017-07-25 ENCOUNTER — Emergency Department (HOSPITAL_BASED_OUTPATIENT_CLINIC_OR_DEPARTMENT_OTHER)
Admission: EM | Admit: 2017-07-25 | Discharge: 2017-07-25 | Disposition: A | Discharge status: Home / Self Care | Payer: Medicaid Other | Attending: Emergency Medicine | Admitting: Emergency Medicine

## 2017-07-25 DIAGNOSIS — F17210 Nicotine dependence, cigarettes, uncomplicated: Secondary | ICD-10-CM | POA: Insufficient documentation

## 2017-07-25 DIAGNOSIS — K0889 Other specified disorders of teeth and supporting structures: Secondary | ICD-10-CM | POA: Insufficient documentation

## 2017-07-25 MED ORDER — BENZOCAINE 10 % MT GEL: 1.0000 "application " | OROMUCOSAL | 0 refills | Status: DC | PRN

## 2017-07-25 MED ORDER — ACETAMINOPHEN 500 MG PO TABS
1000.0000 mg | ORAL_TABLET | Freq: Once | ORAL | Status: AC
  Administered 2017-07-25: 1000 mg via ORAL
  Filled 2017-07-25: qty 2

## 2017-07-25 MED ORDER — PENICILLIN V POTASSIUM 500 MG PO TABS: 500.0000 mg | ORAL_TABLET | Freq: Four times a day (QID) | ORAL | 0 refills | Status: AC

## 2017-07-25 MED FILL — PENICILLIN VK 500 MG TABLET: 500 | 7 days supply | Qty: 28 | Fill #0

## 2017-07-25 NOTE — ED Triage Notes (Signed)
Pt c/o left lower dental pain x 1 month

## 2017-07-25 NOTE — Discharge Instructions (Signed)
Please take entire course of antibiotics as directed.  Continue using ibuprofen and Tylenol, as well as prescribed Orajel for pain.  You will need to follow-up with your dentist for continued management of this.  Return to the emergency department for fevers, swelling or pain under the tongue or in the neck, difficulty breathing or swallowing or any other new or concerning symptoms. 

## 2017-07-25 NOTE — ED Provider Notes (Signed)
[facility] EMERGENCY DEPARTMENT Provider Note   CSN: [id number] Arrival date & time: 07/25/17  1330     History   Chief Complaint Chief Complaint  Patient presents with  . Dental Pain    HPI Taylor Clark is a 37 y.o. female.  Taylor Clark is a 37 y.o. Female with a history of anemia, presents to the emergency department for evaluation of dental pain.  Patient reports pain surrounding her left lower first molar, she is noticed a hole in this.  Patient reports dental pain initially started about a month ago, about 2-1/2 weeks ago she saw a dentist who prescribed her a course of amoxicillin which resolved her dental pain, she completed the entire course of antibiotics, about 3 days ago pain returned.  She reports it started as a dull intermittent ache, but today is much sharper, she has noticed redness and puffiness gums surrounding this tube, but denies any drainage or abscess.  She has not had any fevers, nausea or vomiting.  She reports chewing or having any cold liquid or air touch the tooth is very painful, she has had no difficulty tolerating secretions or fluids.  No pain or swelling under the tongue or in the neck.  She reports some mild tenderness along the left lower jaw, no facial swelling.      Past Medical History:  Diagnosis Date  . Anemia     There are no active problems to display for this patient.   Past Surgical History:  Procedure Laterality Date  . CESAREAN SECTION    . HIP SURGERY  2011  . JOINT REPLACEMENT    . WRIST SURGERY       OB History   None      Home Medications    Prior to Admission medications   Medication Sig Start Date End Date Taking? Authorizing Provider  acetaminophen (TYLENOL) 325 MG tablet Take 650 mg by mouth every 6 (six) hours as needed.    [provider]  albuterol (PROVENTIL HFA;VENTOLIN HFA) 108 (90 Base) MCG/ACT inhaler Inhale 2 puffs into the lungs every 4 (four) hours as needed for  wheezing or shortness of breath. 05/13/17   Horton, Mayer Masker, MD  benzonatate (TESSALON) 200 MG capsule Take 1 capsule (200 mg total) by mouth 3 (three) times daily as needed for cough. 01/28/17   Khatri, Hina, PA-C  diclofenac (VOLTAREN) 75 MG EC tablet Take 1 tablet (75 mg total) by mouth 2 (two) times daily. 01/05/16   Copland, Gwenlyn Found, MD  DULoxetine (CYMBALTA) 30 MG capsule Take 1 capsule (30 mg total) by mouth daily. 02/03/16   Sharlene Dory, DO  fluticasone (FLONASE) 50 MCG/ACT nasal spray Place 1 spray into both nostrils daily. 01/28/17   Khatri, Hina, PA-C  gabapentin (NEURONTIN) 300 MG capsule Take 1 capsule (300 mg total) by mouth 3 (three) times daily. 02/03/16   Sharlene Dory, DO  loratadine (CLARITIN) 10 MG tablet Take 1 tablet (10 mg total) by mouth daily. 05/13/17   Horton, Mayer Masker, MD  naproxen (NAPROSYN) 500 MG tablet Take 1 tablet (500 mg total) by mouth 2 (two) times daily. 01/28/17   Khatri, Hina, PA-C  ondansetron (ZOFRAN ODT) 4 MG disintegrating tablet Take 1 tablet (4 mg total) by mouth every 8 (eight) hours as needed for nausea or vomiting. 01/28/17   Khatri, Hina, PA-C  ondansetron (ZOFRAN ODT) 4 MG disintegrating tablet Take 1 tablet (4 mg total) by mouth every 8 (eight) hours  as needed for nausea or vomiting. 05/13/17   Horton, Mayer Masker, MD  predniSONE (DELTASONE) 20 MG tablet Take 2 tablets (40 mg total) by mouth daily. 05/13/17   Horton, Mayer Masker, MD    Family History Family History  Problem Relation Age of Onset  . Non-Hodgkin's lymphoma Mother   . HIV/AIDS Father        died at age 71  . Anxiety disorder Sister   . Diabetes Maternal Grandmother   . Hypertension Maternal Grandfather   . Diabetes Paternal Grandmother   . Kidney failure Paternal Grandmother     Social History Social History   Tobacco Use  . Smoking status: Current Some Day Smoker    Packs/day: 1.00    Types: Cigarettes  . Smokeless tobacco: Never Used  Substance  Use Topics  . Alcohol use: No  . Drug use: No     Allergies   Hydrocodone and Morphine sulfate   Review of Systems Review of Systems  Constitutional: Negative for chills and fever.  HENT: Positive for dental problem. Negative for drooling, facial swelling and sore throat.   Respiratory: Negative for shortness of breath and stridor.   Gastrointestinal: Negative for nausea and vomiting.  Musculoskeletal: Negative for neck pain and neck stiffness.  Skin: Negative for color change and rash.     Physical Exam Updated Vital Signs BP 135/83 (BP Location: Left Arm)   Pulse 80   Temp 98.1 F (36.7 C)   Resp 18   Ht 5\' 1"  (1.549 m)   Wt 85.3 kg (188 lb)   LMP 07/07/2017   SpO2 98%   BMI 35.52 kg/m   Physical Exam  Constitutional: She appears well-developed and well-nourished. No distress.  HENT:  Head: Normocephalic and atraumatic.  Teeth in general are in poor dentition, the majority of which are broken off at the gumline, left lower first molar with hole present, likely cavity, mild erythema and swelling of surrounding gums, no expressible drainage or appreciable abscess.  No sublingual swelling or tenderness, posterior oropharynx is clear and moist and patient is tolerating her secretions, no trismus or torticollis  Eyes: Right eye exhibits no discharge. Left eye exhibits no discharge.  Neck: Normal range of motion. Neck supple.  Mild lymphadenopathy along the left lower jawline, no stridor  Pulmonary/Chest: Effort normal. No respiratory distress.  Neurological: She is alert. Coordination normal.  Skin: Skin is warm and dry. She is not diaphoretic.  Psychiatric: She has a normal mood and affect. Her behavior is normal.  Nursing note and vitals reviewed.    ED Treatments / Results  Labs (all labs ordered are listed, but only abnormal results are displayed) Labs Reviewed - No data to display  EKG None  Radiology No results found.  Procedures Procedures (including  critical care time)  Medications Ordered in ED Medications - No data to display   Initial Impression / Assessment and Plan / ED Course  I have reviewed the triage vital signs and the nursing notes.  Pertinent labs & imaging results that were available during my care of the patient were reviewed by me and considered in my medical decision making (see chart for details).  Patient with toothache.  No gross abscess.  Exam unconcerning for Ludwig's angina or spread of infection.  Will treat with penicillin and anti-inflammatories medicine.  Urged patient to follow-up with dentist, dental resources provided.  Final Clinical Impressions(s) / ED Diagnoses   Final diagnoses:  Toothache    ED Discharge Orders  Ordered    penicillin v potassium (VEETID) 500 MG tablet  4 times daily     07/25/17 1410    benzocaine (ORAJEL) 10 % mucosal gel  As needed     07/25/17 1410       Dartha LodgeFord, Sayvon Arterberry N, [place]PA-C 07/25/17 1417    Mesner, Barbara CowerJason, MD 07/26/17 1520

## 2017-08-06 ENCOUNTER — Other Ambulatory Visit: Payer: Self-pay

## 2017-08-06 ENCOUNTER — Encounter (HOSPITAL_BASED_OUTPATIENT_CLINIC_OR_DEPARTMENT_OTHER): Payer: Self-pay

## 2017-08-06 ENCOUNTER — Emergency Department (HOSPITAL_BASED_OUTPATIENT_CLINIC_OR_DEPARTMENT_OTHER)
Admission: EM | Admit: 2017-08-06 | Discharge: 2017-08-06 | Disposition: A | Discharge status: Home / Self Care | Payer: Medicaid Other | Attending: Emergency Medicine | Admitting: Emergency Medicine

## 2017-08-06 DIAGNOSIS — F17210 Nicotine dependence, cigarettes, uncomplicated: Secondary | ICD-10-CM | POA: Insufficient documentation

## 2017-08-06 DIAGNOSIS — K047 Periapical abscess without sinus: Secondary | ICD-10-CM | POA: Insufficient documentation

## 2017-08-06 DIAGNOSIS — Z720 Tobacco use: Secondary | ICD-10-CM

## 2017-08-06 DIAGNOSIS — Z79899 Other long term (current) drug therapy: Secondary | ICD-10-CM | POA: Insufficient documentation

## 2017-08-06 DIAGNOSIS — K029 Dental caries, unspecified: Secondary | ICD-10-CM | POA: Insufficient documentation

## 2017-08-06 MED ORDER — KETOROLAC TROMETHAMINE 60 MG/2ML IM SOLN
30.0000 mg | Freq: Once | INTRAMUSCULAR | Status: AC
  Administered 2017-08-06: 30 mg via INTRAMUSCULAR
  Filled 2017-08-06: qty 2

## 2017-08-06 MED ORDER — DOXYCYCLINE HYCLATE 100 MG PO CAPS: 100.0000 mg | ORAL_CAPSULE | Freq: Two times a day (BID) | ORAL | 0 refills | Status: DC

## 2017-08-06 MED ORDER — NAPROXEN 500 MG PO TABS: 500.0000 mg | ORAL_TABLET | Freq: Two times a day (BID) | ORAL | 0 refills | Status: DC | PRN

## 2017-08-06 NOTE — ED Provider Notes (Signed)
[facility] EMERGENCY DEPARTMENT Provider Note   CSN: [id number] Arrival date & time: 08/06/17  1716     History   Chief Complaint Chief Complaint  Patient presents with  . Dental Pain    HPI Taylor Clark is a 37 y.o. female with a PMHx of anemia, who presents to the ED with complaints of recurrent L lower tooth pain which returned 2 days ago, states it's the same issue as she was seen for on 07/25/17.  Chart review reveals that she was seen here on 07/25/17 for L lower dental pain x3 days (per notes, it started after finishing course of amoxicillin that was given to her about 2.5wks prior to the visit by "her dentist"; pain had returned once abx finished); at the ED visit she was given rx's for PCN VK and benzocaine 10% gel and discharged home.  She states that the original amoxicillin rx was given to her by her "sister's dentist", because she doesn't have a dentist; she states she completed it and felt better, then pain returned so she came here, then she took the PCN VK and improved for about 2 days after finishing it, but then 2 days ago her pain returned and has been constant ever since. She states that it's the same pain she's had every time, in the same tooth, for the last 1 month.  She describes the pain as 10/10 constant sharp and throbbing left lower dental pain that radiates into the left ear, worse with chewing and cold air, and unrelieved with Tylenol, Orajel, ibuprofen, and Aleve.  She denies having a dentist currently, and states that she "can't see one until she comes up with $240" which she doesn't have.  She admits to being a cigarette smoker.  She denies any drooling, trismus, gum swelling or drainage, fevers, chills, ear drainage, or any other complaints at this time.  She denies any possibility of pregnancy.  She repeatedly asks for something for pain.   The history is provided by medical records and the patient. No language interpreter was used.  Dental Pain       Past Medical History:  Diagnosis Date  . Anemia     There are no active problems to display for this patient.   Past Surgical History:  Procedure Laterality Date  . CESAREAN SECTION    . HIP SURGERY  2011  . JOINT REPLACEMENT    . WRIST SURGERY       OB History   None      Home Medications    Prior to Admission medications   Medication Sig Start Date End Date Taking? Authorizing Provider  acetaminophen (TYLENOL) 325 MG tablet Take 650 mg by mouth every 6 (six) hours as needed.    [provider]  albuterol (PROVENTIL HFA;VENTOLIN HFA) 108 (90 Base) MCG/ACT inhaler Inhale 2 puffs into the lungs every 4 (four) hours as needed for wheezing or shortness of breath. 05/13/17   Horton, Mayer Masker, MD  benzocaine (ORAJEL) 10 % mucosal gel Use as directed 1 application in the mouth or throat as needed for mouth pain. 07/25/17   Dartha Lodge, PA-C  benzonatate (TESSALON) 200 MG capsule Take 1 capsule (200 mg total) by mouth 3 (three) times daily as needed for cough. 01/28/17   Khatri, Hina, PA-C  diclofenac (VOLTAREN) 75 MG EC tablet Take 1 tablet (75 mg total) by mouth 2 (two) times daily. 01/05/16   Copland, Gwenlyn Found, MD  DULoxetine (CYMBALTA) 30 MG  capsule Take 1 capsule (30 mg total) by mouth daily. 02/03/16   Sharlene Dory, DO  fluticasone (FLONASE) 50 MCG/ACT nasal spray Place 1 spray into both nostrils daily. 01/28/17   Khatri, Hina, PA-C  gabapentin (NEURONTIN) 300 MG capsule Take 1 capsule (300 mg total) by mouth 3 (three) times daily. 02/03/16   Sharlene Dory, DO  loratadine (CLARITIN) 10 MG tablet Take 1 tablet (10 mg total) by mouth daily. 05/13/17   Horton, Mayer Masker, MD  naproxen (NAPROSYN) 500 MG tablet Take 1 tablet (500 mg total) by mouth 2 (two) times daily. 01/28/17   Khatri, Hina, PA-C  ondansetron (ZOFRAN ODT) 4 MG disintegrating tablet Take 1 tablet (4 mg total) by mouth every 8 (eight) hours as needed for nausea or vomiting.  01/28/17   Khatri, Hina, PA-C  ondansetron (ZOFRAN ODT) 4 MG disintegrating tablet Take 1 tablet (4 mg total) by mouth every 8 (eight) hours as needed for nausea or vomiting. 05/13/17   Horton, Mayer Masker, MD  predniSONE (DELTASONE) 20 MG tablet Take 2 tablets (40 mg total) by mouth daily. 05/13/17   Horton, Mayer Masker, MD    Family History Family History  Problem Relation Age of Onset  . Non-Hodgkin's lymphoma Mother   . HIV/AIDS Father        died at age 43  . Anxiety disorder Sister   . Diabetes Maternal Grandmother   . Hypertension Maternal Grandfather   . Diabetes Paternal Grandmother   . Kidney failure Paternal Grandmother     Social History Social History   Tobacco Use  . Smoking status: Current Some Day Smoker    Packs/day: 1.00    Types: Cigarettes  . Smokeless tobacco: Never Used  Substance Use Topics  . Alcohol use: No  . Drug use: No     Allergies   Hydrocodone and Morphine sulfate   Review of Systems Review of Systems  Constitutional: Negative for chills and fever.  HENT: Positive for dental problem and ear pain (from L teeth). Negative for drooling, ear discharge and trouble swallowing.   Allergic/Immunologic: Negative for immunocompromised state.   All other systems reviewed and are negative for acute change except as noted in the HPI.    Physical Exam Updated Vital Signs BP 126/76 (BP Location: Left Arm)   Pulse 99   Temp 98.3 F (36.8 C) (Oral)   Resp 18   Ht 5' (1.524 m)   Wt 85.3 kg (188 lb)   LMP 07/07/2017   SpO2 97%   BMI 36.72 kg/m   Physical Exam  Constitutional: She is oriented to person, place, and time. Vital signs are normal. She appears well-developed and well-nourished.  Non-toxic appearance. No distress.  Afebrile, nontoxic, NAD although somewhat dramatic, initially has her face buried in her shirt  HENT:  Head: Normocephalic and atraumatic.  Right Ear: Hearing and external ear normal. A foreign body (cerumen impaction) is  present.  Left Ear: Hearing and external ear normal. A foreign body (cerumen impaction) is present.  Nose: Nose normal.  Mouth/Throat: Uvula is midline, oropharynx is clear and moist and mucous membranes are normal. No trismus in the jaw. Dental caries present. No dental abscesses or uvula swelling. Tonsils are 0 on the right. Tonsils are 0 on the left. No tonsillar exudate.  Cerumen impaction in b/l ears. Unable to visualize TMs. Canals otherwise clear aside from cerumen impaction, no pain with pinna retraction, no EAC swelling or drainage.  Nose clear.  Diffuse dental decay  to all teeth in her mouth, all of them are decayed down to the gumline; focal area of TTP to L lower teeth #22-23, no surrounding gingival swelling focally in this area but gingiva looks diffusely erythematous and irritated appearing. No dental abscess or evidence of ludwig's. Oropharynx clear and moist, without uvular swelling or deviation, no trismus or drooling, no tonsillar swelling or erythema, no exudates.    Eyes: Conjunctivae and EOM are normal. Right eye exhibits no discharge. Left eye exhibits no discharge.  Neck: Normal range of motion. Neck supple.  Cardiovascular: Normal rate and intact distal pulses.  Pulmonary/Chest: Effort normal. No respiratory distress.  Abdominal: Normal appearance. She exhibits no distension.  Musculoskeletal: Normal range of motion.  Neurological: She is alert and oriented to person, place, and time. She has normal strength. No sensory deficit.  Skin: Skin is warm, dry and intact. No rash noted.  Psychiatric: She has a normal mood and affect. Her behavior is normal.  Nursing note and vitals reviewed.    ED Treatments / Results  Labs (all labs ordered are listed, but only abnormal results are displayed) Labs Reviewed - No data to display  EKG None  Radiology No results found.  Procedures Procedures (including critical care time)  Medications Ordered in ED Medications   ketorolac (TORADOL) injection 30 mg (30 mg Intramuscular Given 08/06/17 1921)     Initial Impression / Assessment and Plan / ED Course  I have reviewed the triage vital signs and the nursing notes.  Pertinent labs & imaging results that were available during my care of the patient were reviewed by me and considered in my medical decision making (see chart for details).     37 y.o. female here with recurrent L lower dental pain, has had pain for a month, was on amoxicillin initially and improved then worsened after finishing it so came here on 07/25/17 and got PCN VK which she took and improved but again worsened 2 days ago. She states she doesn't have a dentist, the person who gave her the original amoxicillin rx was "her sister's dentist". On exam, diffuse dental decay to almost every tooth in her mouth, all of them decayed down to the gumline, with TTP to the L lower teeth #22-23, likely has underlying dental infection but no evidence of dental abscess on exam, patient afebrile, non toxic appearing and swallowing secretions well, no evidence of ludwig's. She is somewhat dramatic and requesting pain medications throughout entirety of exam. Will give toradol IM, but advised that ultimately she needs to f/up with a dentist. No dentist on call today but oral surgeon on call, will give referral to to them and advised to call and see if they're willing to see her, since I suspect she'll need multiple extractions; I will also give her dental resource guide, I stressed the importance of dental follow up for ultimate management of dental pain.  I will also give doxycycline to treat possible underlying dental infection given recurrence of pain after finishing prior abx course, but advised that this may only be a temporary solution and she really needs to see a dentist for ultimate management of her recurrent dental pain. Discussed OTC remedies for pain control. Smoking cessation advised. I explained the diagnosis  and have given explicit precautions to return to the ER including for any other new or worsening symptoms. The patient understands and accepts the medical plan as it's been dictated and I have answered their questions. Discharge instructions concerning home care and  prescriptions have been given. The patient is STABLE and is discharged to home in good condition.    Final Clinical Impressions(s) / ED Diagnoses   Final diagnoses:  Pain due to dental caries  Infected dental caries  Tobacco user    ED Discharge Orders        Ordered    doxycycline (VIBRAMYCIN) 100 MG capsule  2 times daily     08/06/17 1832    naproxen (NAPROSYN) 500 MG tablet  2 times daily PRN     08/06/17 [address] [place], PA-C 08/06/17 1922    [place], Jesusita Oka, DO 08/06/17 2312

## 2017-08-06 NOTE — Discharge Instructions (Signed)
Apply warm compresses to jaw throughout the day. Take antibiotic until finished. Alternate between tylenol and naprosyn as directed as needed for pain. Perform salt water swishes to help with pain/swelling. Use over the counter oragel as needed for additional relief. STOP SMOKING! Followup with a dentist is very important for ongoing evaluation and management of recurrent dental pain, call the oral surgeon listed above in the next 24-48 hours to see if they would be willing to see you for your persistent dental pain, or use the list below to find a dentist in the next 24-48 hours for ongoing management of your dental issue. Return to emergency department for emergent changing or worsening symptoms.

## 2017-08-06 NOTE — ED Triage Notes (Signed)
C/o left lower toothache x 1 month-NAD-steady gait

## 2017-11-12 ENCOUNTER — Emergency Department (HOSPITAL_BASED_OUTPATIENT_CLINIC_OR_DEPARTMENT_OTHER): Payer: Self-pay

## 2017-11-12 ENCOUNTER — Other Ambulatory Visit: Payer: Self-pay

## 2017-11-12 ENCOUNTER — Encounter (HOSPITAL_BASED_OUTPATIENT_CLINIC_OR_DEPARTMENT_OTHER): Payer: Self-pay | Admitting: Emergency Medicine

## 2017-11-12 ENCOUNTER — Emergency Department (HOSPITAL_BASED_OUTPATIENT_CLINIC_OR_DEPARTMENT_OTHER)
Admission: EM | Admit: 2017-11-12 | Discharge: 2017-11-12 | Disposition: A | Discharge status: Home / Self Care | Payer: Self-pay | Attending: Emergency Medicine | Admitting: Emergency Medicine

## 2017-11-12 DIAGNOSIS — J069 Acute upper respiratory infection, unspecified: Secondary | ICD-10-CM | POA: Insufficient documentation

## 2017-11-12 DIAGNOSIS — F17210 Nicotine dependence, cigarettes, uncomplicated: Secondary | ICD-10-CM | POA: Insufficient documentation

## 2017-11-12 DIAGNOSIS — J40 Bronchitis, not specified as acute or chronic: Secondary | ICD-10-CM

## 2017-11-12 DIAGNOSIS — J209 Acute bronchitis, unspecified: Secondary | ICD-10-CM | POA: Insufficient documentation

## 2017-11-12 DIAGNOSIS — B9789 Other viral agents as the cause of diseases classified elsewhere: Secondary | ICD-10-CM

## 2017-11-12 MED ORDER — ONDANSETRON 4 MG PO TBDP: 4.0000 mg | ORAL_TABLET | Freq: Three times a day (TID) | ORAL | 0 refills | Status: DC | PRN

## 2017-11-12 MED ORDER — BENZONATATE 100 MG PO CAPS: 100.0000 mg | ORAL_CAPSULE | Freq: Three times a day (TID) | ORAL | 0 refills | Status: DC | PRN

## 2017-11-12 MED ORDER — ALBUTEROL SULFATE HFA 108 (90 BASE) MCG/ACT IN AERS
2.0000 | INHALATION_SPRAY | Freq: Once | RESPIRATORY_TRACT | Status: AC
  Administered 2017-11-12: 2 via RESPIRATORY_TRACT
  Filled 2017-11-12: qty 6.7

## 2017-11-12 NOTE — ED Triage Notes (Signed)
Pt reports "Run-down feeling". Sinus congestion, Headache, scratchy throat, body aches, cough productive of yellow sputum.

## 2017-11-12 NOTE — ED Notes (Signed)
NAD at this time. Pt is stable and going home.  

## 2017-11-12 NOTE — ED Provider Notes (Signed)
[facility] EMERGENCY DEPARTMENT Provider Note   CSN: [id number] Arrival date & time: 11/12/17  1026     History   Chief Complaint Chief Complaint  Patient presents with  . URI    HPI Taylor Clark is a 37 y.o. female.  HPI   Headaches, body aches Last week was sweating more than she would expect on the hot days we had.  Thought maybe was menopause.   Saturday AM woke up and felt like both ears stopped up. Went to [facility], cleaned ears out.  Sunday felt better Sunday night went to work 4PM, 9, 930PM felt run down, didn't drink a lot of water,  Monday morning woke up with cough, Monday afternoon still coughing, yellow mucous and yellow from nose Headache has been throbbing back of eyes. Monday also started having congestion too, a little bit of facial pain Nausea and vomiting, threw up this AM No abdominal pain Diarrhea, yesterday, 2 epsides No known fever Chills  Past Medical History:  Diagnosis Date  . Anemia     There are no active problems to display for this patient.   Past Surgical History:  Procedure Laterality Date  . CESAREAN SECTION    . HIP SURGERY  2011  . JOINT REPLACEMENT    . WRIST SURGERY       OB History   None      Home Medications    Prior to Admission medications   Medication Sig Start Date End Date Taking? Authorizing Provider  benzonatate (TESSALON) 100 MG capsule Take 1 capsule (100 mg total) by mouth 3 (three) times daily as needed for cough. 11/12/17   Alvira Monday, MD  fluticasone (FLONASE) 50 MCG/ACT nasal spray Place 1 spray into both nostrils daily. 01/28/17   Khatri, Hina, PA-C  loratadine (CLARITIN) 10 MG tablet Take 1 tablet (10 mg total) by mouth daily. 05/13/17   Horton, Mayer Masker, MD    Family History Family History  Problem Relation Age of Onset  . Non-Hodgkin's lymphoma Mother   . HIV/AIDS Father        died at age 14  . Anxiety disorder Sister   . Diabetes Maternal Grandmother   . Hypertension  Maternal Grandfather   . Diabetes Paternal Grandmother   . Kidney failure Paternal Grandmother     Social History Social History   Tobacco Use  . Smoking status: Current Some Day Smoker    Packs/day: 0.50    Types: Cigarettes  . Smokeless tobacco: Never Used  Substance Use Topics  . Alcohol use: No  . Drug use: No     Allergies   Hydrocodone and Morphine sulfate   Review of Systems Review of Systems  Constitutional: Positive for chills. Negative for fever.  HENT: Positive for congestion and rhinorrhea. Negative for sore throat (scratchy).   Eyes: Negative for visual disturbance.  Respiratory: Positive for cough. Negative for shortness of breath.   Cardiovascular: Negative for chest pain.  Gastrointestinal: Positive for diarrhea, nausea and vomiting. Negative for abdominal pain.  Genitourinary: Negative for difficulty urinating.  Musculoskeletal: Negative for back pain and neck pain.  Skin: Negative for rash.  Neurological: Positive for headaches. Negative for syncope.     Physical Exam Updated Vital Signs BP 115/80 (BP Location: Right Arm)   Pulse 81   Temp 98.8 F (37.1 C) (Oral)   Resp 16   Ht 5' (1.524 m)   Wt 85.3 kg   LMP 10/15/2017   SpO2 99%  BMI 36.72 kg/m   Physical Exam  Constitutional: She is oriented to person, place, and time. She appears well-developed and well-nourished. No distress.  HENT:  Head: Normocephalic and atraumatic.  Eyes: Conjunctivae and EOM are normal.  Neck: Normal range of motion.  Cardiovascular: Normal rate, regular rhythm, normal heart sounds and intact distal pulses. Exam reveals no gallop and no friction rub.  No murmur heard. Pulmonary/Chest: Effort normal and breath sounds normal. No respiratory distress. She has no wheezes. She has no rales.  Abdominal: Soft. She exhibits no distension. There is no tenderness. There is no guarding.  Musculoskeletal: She exhibits no edema or tenderness.  Neurological: She is alert  and oriented to person, place, and time.  Skin: Skin is warm and dry. No rash noted. She is not diaphoretic. No erythema.  Nursing note and vitals reviewed.    ED Treatments / Results  Labs (all labs ordered are listed, but only abnormal results are displayed) Labs Reviewed - No data to display  EKG None  Radiology Dg Chest 2 View  Result Date: 11/12/2017 CLINICAL DATA:  Cough and congestion EXAM: CHEST - 2 VIEW COMPARISON:  May 13, 2017 FINDINGS: Lungs are clear. Heart size and pulmonary vascularity are normal. No adenopathy. No bone lesions. IMPRESSION: No edema or consolidation. Electronically Signed   By: Bretta Bang III M.D.   On: 11/12/2017 10:58    Procedures Procedures (including critical care time)  Medications Ordered in ED Medications  albuterol (PROVENTIL HFA;VENTOLIN HFA) 108 (90 Base) MCG/ACT inhaler 2 puff (has no administration in time range)     Initial Impression / Assessment and Plan / ED Course  I have reviewed the triage vital signs and the nursing notes.  Pertinent labs & imaging results that were available during my care of the patient were reviewed by me and considered in my medical decision making (see chart for details).    37 year old female with history above presents with concern for cough, congestion, headaches for 2 days.  Chest x-ray done shows no signs of pneumonia.  Given duration of symptoms for 3 days, no fever,  have low suspicion for bacterial sinusitis or otitis.  Suspect most likely viral upper respiratory infection as etiology of patient's symptoms. Declines pregnancy test. Recommend continued supportive care. Given rx for tessalon, zofran, and given albuterol inhaler.    Recommend smoking cessation.  Patient discharged in stable condition with understanding of reasons to return.   Final Clinical Impressions(s) / ED Diagnoses   Final diagnoses:  Viral URI with cough  Bronchitis    ED Discharge Orders         Ordered     benzonatate (TESSALON) 100 MG capsule  3 times daily PRN     11/12/17 1117           Alvira Monday, MD 11/12/17 1124

## 2018-04-09 IMAGING — CT CT ABD-PELV W/ CM
2 of 4 series · 16 of 46 positions shown, 18 images · IV contrast (APPLIED)
Comparison: None.

CLINICAL DATA: 35-year-old with abdominal pain. Nausea and
diarrhea.

EXAM:
CT ABDOMEN AND PELVIS WITH CONTRAST
TECHNIQUE: Multidetector CT imaging of the abdomen and pelvis was performed
using the standard protocol following bolus administration of
intravenous contrast.
CONTRAST:  100mL 24E0WT-UTT IOPAMIDOL (24E0WT-UTT) INJECTION 61%

[Series 2: axial st · axial · 0.94mm/px · z∈[-462,-42]mm · 13 of 92 slices shown, 15 images]
[im 4/92  soft-tissue]
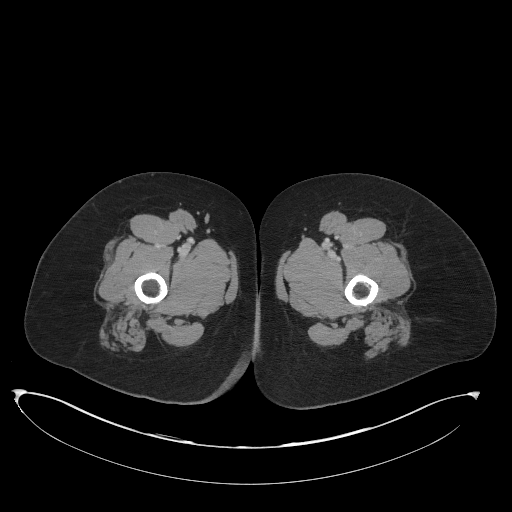
[im 4/92  bone]
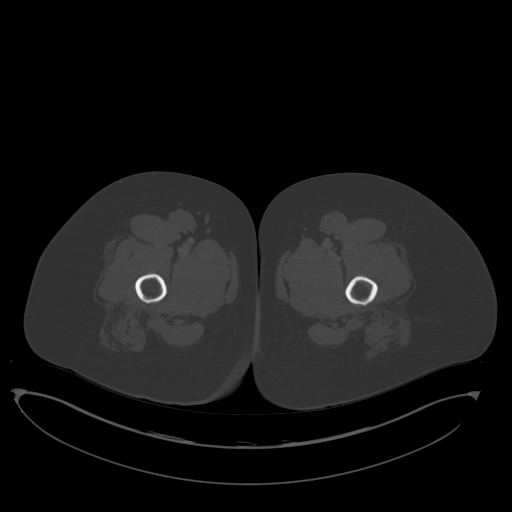
[im 12/92  soft-tissue]
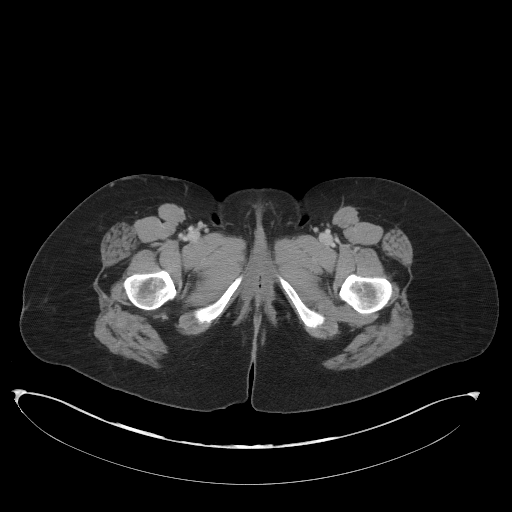
[im 19/92  soft-tissue]
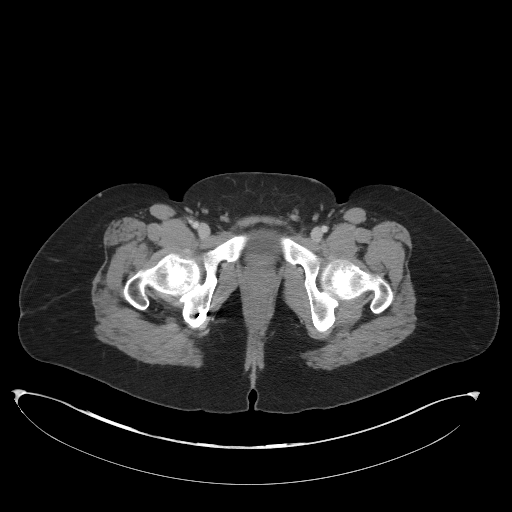
[im 27/92  soft-tissue]
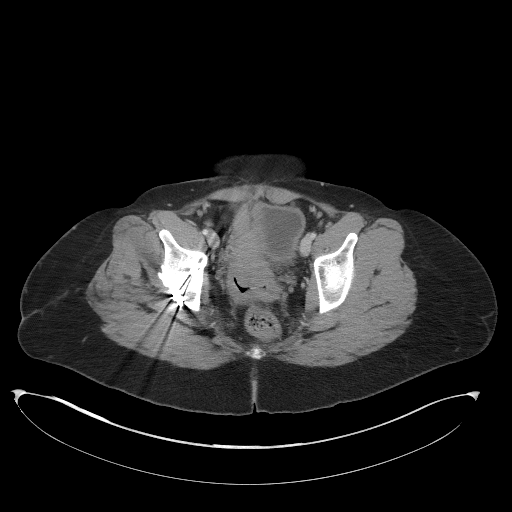
[im 31/92  soft-tissue]
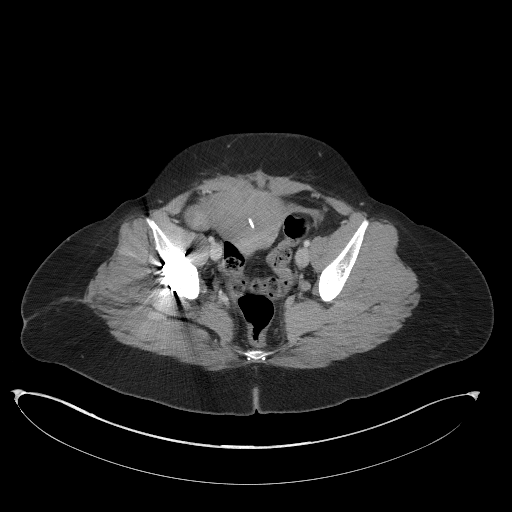
[im 38/92  soft-tissue]
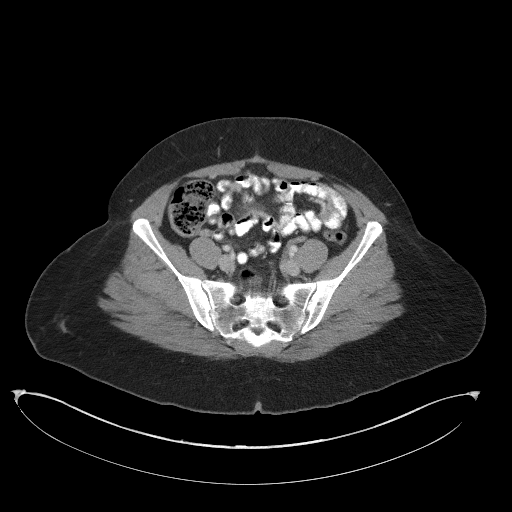
[im 46/92  soft-tissue]
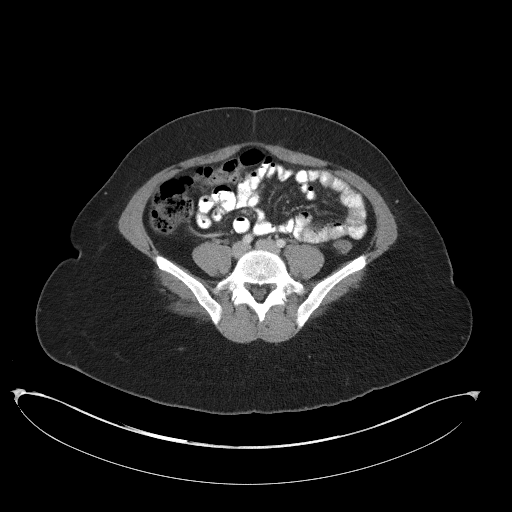
[im 54/92  soft-tissue]
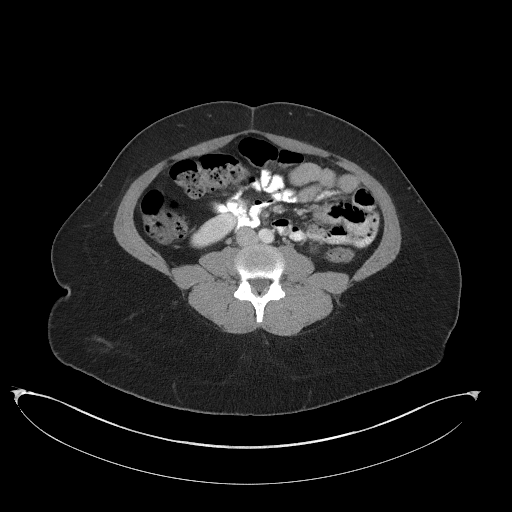
[im 61/92  soft-tissue]
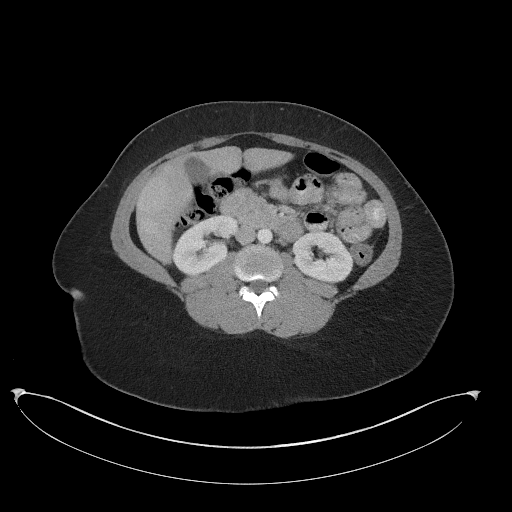
[im 61/92  bone]
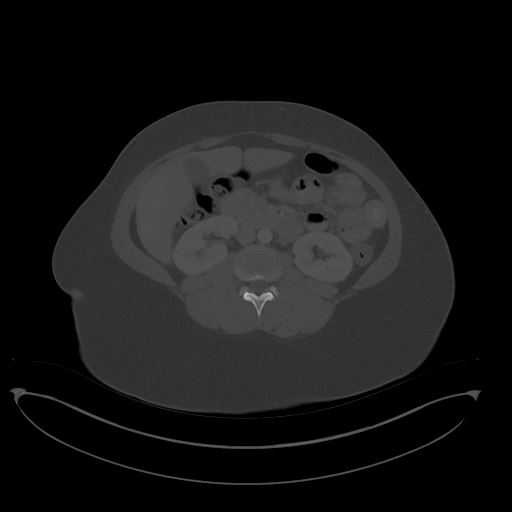
[im 65/92  soft-tissue]
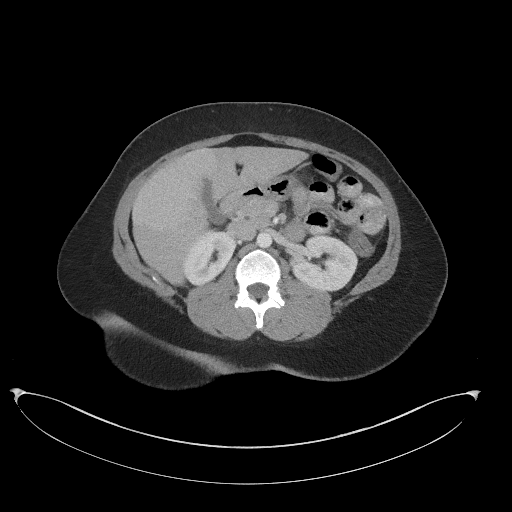
[im 73/92  soft-tissue]
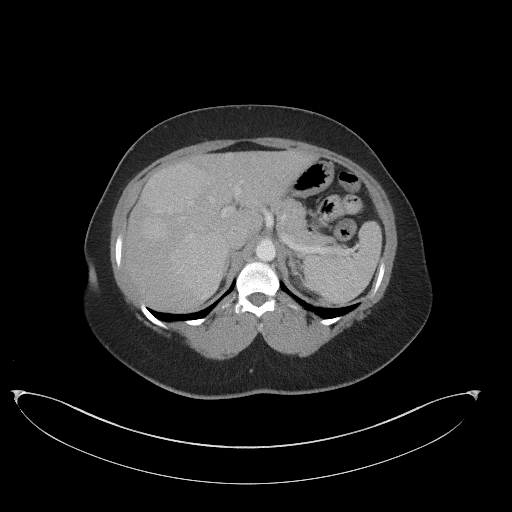
[im 80/92  soft-tissue]
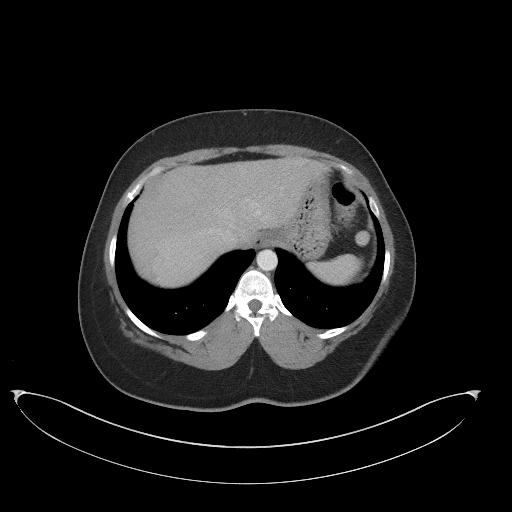
[im 88/92  soft-tissue]
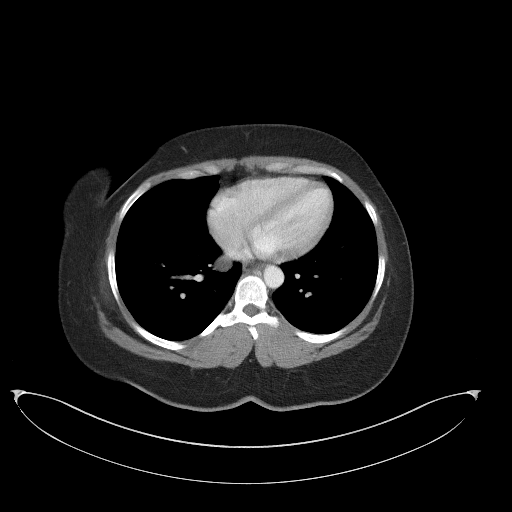

[Series 5: coronal st · coronal · 0.92mm/px · 3 of 101 slices shown]
[im 34/101  soft-tissue]
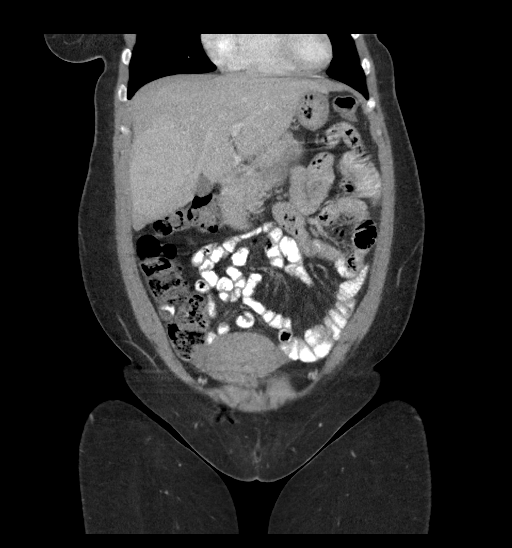
[im 45/101  soft-tissue]
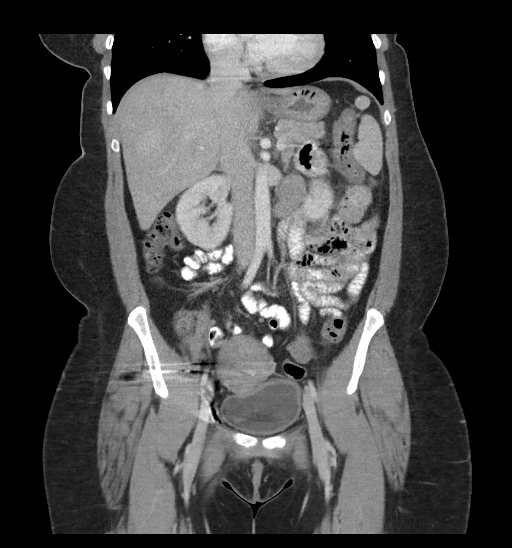
[im 56/101  soft-tissue]
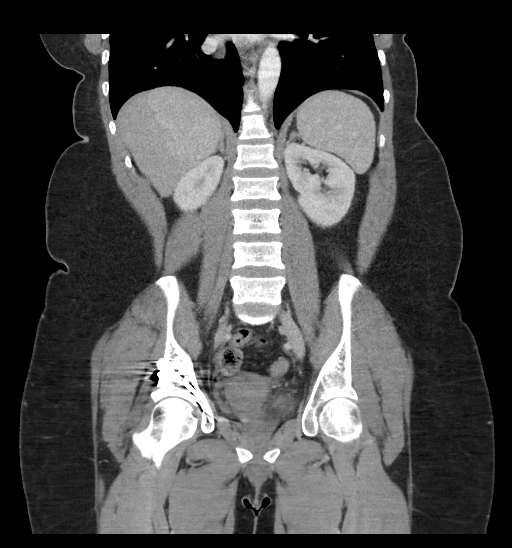

[16 of 46 positions shown; findings below may reference images not displayed]

FINDINGS: Lower chest: Lung bases are clear.

Hepatobiliary: There are scattered hyperdense lesions throughout the
liver. One of the larger lesions is in the anterior right hepatic
lobe measuring up to 9.5 cm. There is another large lesion in the
medial right hepatic lobe measuring up to 5.6 cm. Other hyperdense
areas are much smaller and nodular. Despite the large number of
hyperdense lesions in liver, there is not a large amount of mass
effect or liver enlargement. Main portal vein appears to be patent.
No biliary dilatation. Normal appearance of the gallbladder.

Pancreas: Normal appearance of the pancreas without inflammation or
duct dilatation.

Spleen: Normal appearance of spleen without enlargement.

Adrenals/Urinary Tract: Normal adrenal glands. Normal appearance
both kidneys without hydronephrosis. Urinary bladder is
unremarkable.

Stomach/Bowel: Stomach is within normal limits. Appendix appears
normal. No evidence of bowel wall thickening, distention, or
inflammatory changes.

Vascular/Lymphatic: No significant vascular findings are present. No
enlarged abdominal or pelvic lymph nodes.

Reproductive: Uterus is unremarkable except for an IUD. No gross
abnormality to the ovary/adnexa.

Other: No free fluid. No large abdominal wall hernia. Negative for
free air.

Musculoskeletal: Patient has plate and screw fixation in the right
hemipelvis. Both hips are located.
IMPRESSION: Abnormal appearance of the liver. There are numerous hyperdense
lesions scattered throughout the liver. Differential diagnosis for
these hepatic lesions include multiple focal nodular hyperplasias
and multiple hepatic adenomas.

The lack of significant mass effect also raises the possibility that
this could be related to multiple areas of fat sparing but probably
less likely. Recommend further characterization of the liver with
MRI with Eovist. Metastatic process or malignant neoplasms are
thought to be less likely unless the patient has risk factors.

These results were called by telephone at the time of interpretation
acknowledged these results.

## 2018-05-15 ENCOUNTER — Telehealth: Payer: Self-pay

## 2018-05-15 NOTE — Telephone Encounter (Signed)
Multiple ED visits- last ov 2017.

## 2018-09-28 ENCOUNTER — Other Ambulatory Visit: Payer: Self-pay

## 2018-09-28 ENCOUNTER — Encounter (HOSPITAL_BASED_OUTPATIENT_CLINIC_OR_DEPARTMENT_OTHER): Payer: Self-pay | Admitting: Emergency Medicine

## 2018-09-28 ENCOUNTER — Emergency Department (HOSPITAL_BASED_OUTPATIENT_CLINIC_OR_DEPARTMENT_OTHER)
Admission: EM | Admit: 2018-09-28 | Discharge: 2018-09-28 | Disposition: A | Discharge status: Home / Self Care | Payer: Medicaid Other | Attending: Emergency Medicine | Admitting: Emergency Medicine

## 2018-09-28 DIAGNOSIS — M62830 Muscle spasm of back: Secondary | ICD-10-CM | POA: Insufficient documentation

## 2018-09-28 DIAGNOSIS — F17210 Nicotine dependence, cigarettes, uncomplicated: Secondary | ICD-10-CM | POA: Diagnosis not present

## 2018-09-28 DIAGNOSIS — Z79899 Other long term (current) drug therapy: Secondary | ICD-10-CM | POA: Insufficient documentation

## 2018-09-28 DIAGNOSIS — R0789 Other chest pain: Secondary | ICD-10-CM | POA: Diagnosis present

## 2018-09-28 LAB — URINALYSIS, ROUTINE W REFLEX MICROSCOPIC
Glucose, UA: NEGATIVE mg/dL
Hgb urine dipstick: NEGATIVE
Ketones, ur: NEGATIVE mg/dL
Leukocytes,Ua: NEGATIVE
Nitrite: NEGATIVE
Protein, ur: 30 mg/dL — AB
Specific Gravity, Urine: 1.025 (ref 1.005–1.030)
pH: 6.5 (ref 5.0–8.0)

## 2018-09-28 LAB — URINALYSIS, MICROSCOPIC (REFLEX)

## 2018-09-28 LAB — PREGNANCY, URINE: Preg Test, Ur: NEGATIVE

## 2018-09-28 MED ORDER — DIAZEPAM 5 MG PO TABS: 5.0000 mg | ORAL_TABLET | Freq: Two times a day (BID) | ORAL | 0 refills | Status: DC | PRN

## 2018-09-28 MED ORDER — NAPROXEN 500 MG PO TABS: 500.0000 mg | ORAL_TABLET | Freq: Two times a day (BID) | ORAL | 0 refills | Status: DC

## 2018-09-28 NOTE — ED Notes (Signed)
ED Provider at bedside. 

## 2018-09-28 NOTE — ED Triage Notes (Signed)
LUQ pain radiates to flank since Friday, after reaching to clean mirror and then bent over and has been hurting since. Denies urinary s/s or n/v

## 2018-09-28 NOTE — ED Provider Notes (Signed)
[facility] EMERGENCY DEPARTMENT Provider Note   CSN: [id number] Arrival date & time: 09/28/18  1619     History   Chief Complaint Chief Complaint  Patient presents with  . Abdominal Pain    HPI Taylor Clark is a 38 y.o. female who presents to ED for left-sided rib pain.  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  States that she felt a pulling sensation in this area and has been intermittent since then.  She has tried muscle rub, a bath, tramadol with no improvement in her symptoms.  States that massaging the area did help.  States that she had a similar muscle strain in this area several years ago which healed over time.  She denies any direct injury to the area.  Denies any chest pain, shortness of breath, vomiting, urinary symptoms, cough, fever.     HPI  Past Medical History:  Diagnosis Date  . Anemia     There are no active problems to display for this patient.   Past Surgical History:  Procedure Laterality Date  . CESAREAN SECTION    . HIP SURGERY  2011  . JOINT REPLACEMENT    . WRIST SURGERY       OB History   No obstetric history on file.      Home Medications    Prior to Admission medications   Medication Sig Start Date End Date Taking? Authorizing Provider  benzonatate (TESSALON) 100 MG capsule Take 1 capsule (100 mg total) by mouth 3 (three) times daily as needed for cough. 11/12/17   Alvira MondaySchlossman, Erin, MD  diazepam (VALIUM) 5 MG tablet Take 1 tablet (5 mg total) by mouth every 12 (twelve) hours as needed for anxiety. 09/28/18   Marine Lezotte, PA-C  fluticasone (FLONASE) 50 MCG/ACT nasal spray Place 1 spray into both nostrils daily. 01/28/17   Melaney Tellefsen, PA-C  loratadine (CLARITIN) 10 MG tablet Take 1 tablet (10 mg total) by mouth daily. 05/13/17   Horton, Mayer Maskerourtney F, MD  naproxen (NAPROSYN) 500 MG tablet Take 1 tablet (500 mg total) by mouth 2 (two) times daily. 09/28/18   Rolly Magri, PA-C  ondansetron  (ZOFRAN ODT) 4 MG disintegrating tablet Take 1 tablet (4 mg total) by mouth every 8 (eight) hours as needed for nausea or vomiting. 11/12/17   Alvira MondaySchlossman, Erin, MD    Family History Family History  Problem Relation Age of Onset  . Non-Hodgkin's lymphoma Mother   . HIV/AIDS Father        died at age 39  . Anxiety disorder Sister   . Diabetes Maternal Grandmother   . Hypertension Maternal Grandfather   . Diabetes Paternal Grandmother   . Kidney failure Paternal Grandmother     Social History Social History   Tobacco Use  . Smoking status: Current Some Day Smoker    Packs/day: 0.50    Types: Cigarettes  . Smokeless tobacco: Never Used  Substance Use Topics  . Alcohol use: No  . Drug use: No     Allergies   Morphine sulfate   Review of Systems Review of Systems  Constitutional: Negative for appetite change, chills and fever.  HENT: Negative for ear pain, rhinorrhea, sneezing and sore throat.   Eyes: Negative for photophobia and visual disturbance.  Respiratory: Negative for cough, chest tightness, shortness of breath and wheezing.   Cardiovascular: Negative for chest pain and palpitations.  Gastrointestinal: Negative for abdominal pain, blood in stool, constipation, diarrhea,  nausea and vomiting.  Genitourinary: Negative for dysuria, hematuria and urgency.  Musculoskeletal: Positive for myalgias.  Skin: Negative for rash.  Neurological: Negative for dizziness, weakness and light-headedness.     Physical Exam Updated Vital Signs BP (!) 131/101 (BP Location: Left Arm)   Pulse 73   Temp 98.3 F (36.8 C) (Oral)   Resp 18   Ht 5' (1.524 m)   Wt 86.7 kg   LMP 09/04/2018   SpO2 100%   BMI 37.34 kg/m   Physical Exam Vitals signs and nursing note reviewed.  Constitutional:      General: She is not in acute distress.    Appearance: She is well-developed.     Comments: Speaking in complete sentences without difficulty.  HENT:     Head: Normocephalic and  atraumatic.     Nose: Nose normal.  Eyes:     General: No scleral icterus.       Left eye: No discharge.     Conjunctiva/sclera: Conjunctivae normal.  Neck:     Musculoskeletal: Normal range of motion and neck supple.  Cardiovascular:     Rate and Rhythm: Normal rate and regular rhythm.     Heart sounds: Normal heart sounds. No murmur. No friction rub. No gallop.   Pulmonary:     Effort: Pulmonary effort is normal. No respiratory distress.     Breath sounds: Normal breath sounds.    Abdominal:     General: Bowel sounds are normal. There is no distension.     Palpations: Abdomen is soft.     Tenderness: There is no abdominal tenderness. There is no guarding.  Musculoskeletal: Normal range of motion.        General: Tenderness present.  Skin:    General: Skin is warm and dry.     Findings: No rash.  Neurological:     Mental Status: She is alert.     Motor: No abnormal muscle tone.     Coordination: Coordination normal.      ED Treatments / Results  Labs (all labs ordered are listed, but only abnormal results are displayed) Labs Reviewed  URINALYSIS, ROUTINE W REFLEX MICROSCOPIC - Abnormal; Notable for the following components:      Result Value   Color, Urine BROWN (*)    APPearance HAZY (*)    Bilirubin Urine SMALL (*)    Protein, ur 30 (*)    All other components within normal limits  URINALYSIS, MICROSCOPIC (REFLEX) - Abnormal; Notable for the following components:   Bacteria, UA MANY (*)    All other components within normal limits  PREGNANCY, URINE    EKG None  Radiology No results found.  Procedures Procedures (including critical care time)  Medications Ordered in ED Medications - No data to display   Initial Impression / Assessment and Plan / ED Course  I have reviewed the triage vital signs and the nursing notes.  Pertinent labs & imaging results that were available during my care of the patient were reviewed by me and considered in my medical  decision making (see chart for details).        38 year old female presents to ED for muscle strain in her back.  States that she was reaching up to clean mirrors at work about 2 days ago when she developed a sharp pain.  States that this happened her in the past in this exact area which resolved over time.  She denies any chest pain, shortness of breath of cough and feels  that her symptoms are not due to her heart or lungs.  She denies any urinary symptoms, vomiting.  On my exam there is tenderness palpation of the left upper back which she reports worsens with certain movements.  She is not tachycardic, tachypneic or hypoxic.  EKG with no ischemic findings.  Urinalysis with many bacteria however patient denies any urinary symptoms.  Pregnancy test is negative.  She will be given muscle relaxer and anti-inflammatories and advised to complete heat therapy for what appears to be musculoskeletal spasm/strain.  Doubt cardiac or pulmonary cause as her EKG is unremarkable, she denies chest pain and is low risk.  Patient agreeable to the plan.  Advised to return for worsening symptoms.  Patient is hemodynamically stable, in NAD, and able to ambulate in the ED. Evaluation does not show pathology that would require ongoing emergent intervention or inpatient treatment. I explained the diagnosis to the patient. Pain has been managed and has no complaints prior to discharge. Patient is comfortable with above plan and is stable for discharge at this time. All questions were answered prior to disposition. Strict return precautions for returning to the ED were discussed. Encouraged follow up with PCP.   An After Visit Summary was printed and given to the patient.   Portions of this note were generated with Scientist, clinical (histocompatibility and immunogenetics)Dragon dictation software. Dictation errors may occur despite best attempts at proofreading.   Final Clinical Impressions(s) / ED Diagnoses   Final diagnoses:  Muscle spasm of back    ED Discharge Orders          Ordered    diazepam (VALIUM) 5 MG tablet  Every 12 hours PRN     09/28/18 1712    naproxen (NAPROSYN) 500 MG tablet  2 times daily     09/28/18 1712           Dietrich PatesKhatri, Annabella Elford, PA-C 09/28/18 1718    Vanetta MuldersZackowski, Scott, MD 10/04/18 1229

## 2018-09-28 NOTE — Discharge Instructions (Signed)
Take the medications to help with your symptoms. Heat therapy, stretching and massaging the area will help as well. Return to the ED if you start to have worsening symptoms, develop chest pain or shortness of breath, coughing up blood, leg swelling.

## 2018-09-28 NOTE — ED Notes (Signed)
Pt has been taking ibuprofen/tylenol and dr. Yvette Rack relaxing baths with no relief. Pt also took sisters tramadol with no relief. Pain is increased with movement

## 2018-10-24 ENCOUNTER — Other Ambulatory Visit: Payer: Self-pay

## 2018-10-24 ENCOUNTER — Emergency Department (HOSPITAL_BASED_OUTPATIENT_CLINIC_OR_DEPARTMENT_OTHER): Payer: Medicaid Other

## 2018-10-24 ENCOUNTER — Emergency Department (HOSPITAL_BASED_OUTPATIENT_CLINIC_OR_DEPARTMENT_OTHER)
Admission: EM | Admit: 2018-10-24 | Discharge: 2018-10-24 | Disposition: A | Discharge status: Home / Self Care | Payer: Medicaid Other | Attending: Emergency Medicine | Admitting: Emergency Medicine

## 2018-10-24 ENCOUNTER — Encounter (HOSPITAL_BASED_OUTPATIENT_CLINIC_OR_DEPARTMENT_OTHER): Payer: Self-pay | Admitting: *Deleted

## 2018-10-24 DIAGNOSIS — F17210 Nicotine dependence, cigarettes, uncomplicated: Secondary | ICD-10-CM | POA: Insufficient documentation

## 2018-10-24 DIAGNOSIS — Y9383 Activity, rough housing and horseplay: Secondary | ICD-10-CM | POA: Diagnosis not present

## 2018-10-24 DIAGNOSIS — Y929 Unspecified place or not applicable: Secondary | ICD-10-CM | POA: Insufficient documentation

## 2018-10-24 DIAGNOSIS — Y999 Unspecified external cause status: Secondary | ICD-10-CM | POA: Insufficient documentation

## 2018-10-24 DIAGNOSIS — W51XXXA Accidental striking against or bumped into by another person, initial encounter: Secondary | ICD-10-CM | POA: Diagnosis not present

## 2018-10-24 DIAGNOSIS — Z79899 Other long term (current) drug therapy: Secondary | ICD-10-CM | POA: Insufficient documentation

## 2018-10-24 DIAGNOSIS — S6992XA Unspecified injury of left wrist, hand and finger(s), initial encounter: Secondary | ICD-10-CM | POA: Diagnosis present

## 2018-10-24 DIAGNOSIS — Z885 Allergy status to narcotic agent status: Secondary | ICD-10-CM | POA: Insufficient documentation

## 2018-10-24 DIAGNOSIS — S63502A Unspecified sprain of left wrist, initial encounter: Secondary | ICD-10-CM

## 2018-10-24 DIAGNOSIS — S6392XA Sprain of unspecified part of left wrist and hand, initial encounter: Secondary | ICD-10-CM | POA: Diagnosis not present

## 2018-10-24 MED ORDER — HYDROCODONE-ACETAMINOPHEN 5-325 MG PO TABS: 1.0000 | ORAL_TABLET | ORAL | 0 refills | Status: DC | PRN

## 2018-10-24 MED ORDER — IBUPROFEN 600 MG PO TABS: 600.0000 mg | ORAL_TABLET | Freq: Four times a day (QID) | ORAL | 0 refills | Status: DC | PRN

## 2018-10-24 NOTE — ED Provider Notes (Signed)
[facility] EMERGENCY DEPARTMENT Provider Note   CSN: [id number] Arrival date & time: 10/24/18  [id number]     History   Chief Complaint Chief Complaint  Patient presents with  . Hand Pain    HPI Taylor Clark is a 38 y.o. female.     Pt presents to the ED today with left hand and wrist pain.  Pt is left handed and was "play fighting" with her 3 children last night.  Since then, she has had pain and swelling.  No other injuries.     Past Medical History:  Diagnosis Date  . Anemia     There are no active problems to display for this patient.   Past Surgical History:  Procedure Laterality Date  . CESAREAN SECTION    . HIP SURGERY  2011  . JOINT REPLACEMENT    . WRIST SURGERY       OB History   No obstetric history on file.      Home Medications    Prior to Admission medications   Medication Sig Start Date End Date Taking? Authorizing Provider  benzonatate (TESSALON) 100 MG capsule Take 1 capsule (100 mg total) by mouth 3 (three) times daily as needed for cough. 11/12/17   Alvira MondaySchlossman, Erin, MD  diazepam (VALIUM) 5 MG tablet Take 1 tablet (5 mg total) by mouth every 12 (twelve) hours as needed for anxiety. 09/28/18   Khatri, Hina, PA-C  fluticasone (FLONASE) 50 MCG/ACT nasal spray Place 1 spray into both nostrils daily. 01/28/17   Khatri, Hina, PA-C  HYDROcodone-acetaminophen (NORCO/VICODIN) 5-325 MG tablet Take 1 tablet by mouth every 4 (four) hours as needed. 10/24/18   Jacalyn LefevreHaviland, Corwin Kuiken, MD  ibuprofen (ADVIL) 600 MG tablet Take 1 tablet (600 mg total) by mouth every 6 (six) hours as needed. 10/24/18   Jacalyn LefevreHaviland, Lauryl Seyer, MD  loratadine (CLARITIN) 10 MG tablet Take 1 tablet (10 mg total) by mouth daily. 05/13/17   Horton, Mayer Maskerourtney F, MD  naproxen (NAPROSYN) 500 MG tablet Take 1 tablet (500 mg total) by mouth 2 (two) times daily. 09/28/18   Khatri, Hina, PA-C  ondansetron (ZOFRAN ODT) 4 MG disintegrating tablet Take 1 tablet (4 mg total) by mouth every 8 (eight)  hours as needed for nausea or vomiting. 11/12/17   Alvira MondaySchlossman, Erin, MD    Family History Family History  Problem Relation Age of Onset  . Non-Hodgkin's lymphoma Mother   . HIV/AIDS Father        died at age 39  . Anxiety disorder Sister   . Diabetes Maternal Grandmother   . Hypertension Maternal Grandfather   . Diabetes Paternal Grandmother   . Kidney failure Paternal Grandmother     Social History Social History   Tobacco Use  . Smoking status: Current Some Day Smoker    Packs/day: 0.50    Types: Cigarettes  . Smokeless tobacco: Never Used  Substance Use Topics  . Alcohol use: No  . Drug use: No     Allergies   Morphine sulfate   Review of Systems Review of Systems  Musculoskeletal:       Left hand pain  All other systems reviewed and are negative.    Physical Exam Updated Vital Signs BP 119/80 (BP Location: Right Arm)   Pulse 73   Temp 98.5 F (36.9 C) (Oral)   Resp 16   Ht 5' (1.524 m)   Wt 86.6 kg   LMP 10/05/2018 (Approximate)   SpO2 100%   BMI 37.30 kg/m  Physical Exam Vitals signs and nursing note reviewed.  Constitutional:      Appearance: Normal appearance.  HENT:     Head: Normocephalic and atraumatic.     Right Ear: External ear normal.     Left Ear: External ear normal.     Nose: Nose normal.     Mouth/Throat:     Mouth: Mucous membranes are moist.     Pharynx: Oropharynx is clear.  Eyes:     Extraocular Movements: Extraocular movements intact.     Conjunctiva/sclera: Conjunctivae normal.     Pupils: Pupils are equal, round, and reactive to light.  Neck:     Musculoskeletal: Normal range of motion and neck supple.  Cardiovascular:     Rate and Rhythm: Normal rate and regular rhythm.     Pulses: Normal pulses.     Heart sounds: Normal heart sounds.  Pulmonary:     Effort: Pulmonary effort is normal.     Breath sounds: Normal breath sounds.  Abdominal:     General: Abdomen is flat. Bowel sounds are normal.     Palpations:  Abdomen is soft.  Musculoskeletal:     Comments: Left hand pain and swelling  Skin:    General: Skin is warm.     Capillary Refill: Capillary refill takes less than 2 seconds.  Neurological:     General: No focal deficit present.     Mental Status: She is alert and oriented to person, place, and time.  Psychiatric:        Mood and Affect: Mood normal.        Behavior: Behavior normal.      ED Treatments / Results  Labs (all labs ordered are listed, but only abnormal results are displayed) Labs Reviewed - No data to display  EKG None  Radiology No results found.  Procedures Procedures (including critical care time)  Medications Ordered in ED Medications - No data to display   Initial Impression / Assessment and Plan / ED Course  I have reviewed the triage vital signs and the nursing notes.  Pertinent labs & imaging results that were available during my care of the patient were reviewed by me and considered in my medical decision making (see chart for details).       No fx.  Pt placed in a velcro splint and instructed to f/u with hand.  Return if worse.  Final Clinical Impressions(s) / ED Diagnoses   Final diagnoses:  Sprain of left hand, initial encounter  Sprain of left wrist, initial encounter    ED Discharge Orders         Ordered    HYDROcodone-acetaminophen (NORCO/VICODIN) 5-325 MG tablet  Every 4 hours PRN     10/24/18 1047    ibuprofen (ADVIL) 600 MG tablet  Every 6 hours PRN     10/24/18 1047           Isla Pence, MD 10/28/18 0715

## 2018-10-24 NOTE — ED Triage Notes (Signed)
Injured left hand playing w kids last pm pain to left side of hand and little finger and ring finger

## 2018-10-28 ENCOUNTER — Encounter (HOSPITAL_BASED_OUTPATIENT_CLINIC_OR_DEPARTMENT_OTHER): Payer: Self-pay | Admitting: *Deleted

## 2018-10-28 ENCOUNTER — Other Ambulatory Visit: Payer: Self-pay

## 2018-10-28 ENCOUNTER — Emergency Department (HOSPITAL_BASED_OUTPATIENT_CLINIC_OR_DEPARTMENT_OTHER)
Admission: EM | Admit: 2018-10-28 | Discharge: 2018-10-28 | Disposition: A | Discharge status: Home / Self Care | Payer: Medicaid Other | Attending: Emergency Medicine | Admitting: Emergency Medicine

## 2018-10-28 ENCOUNTER — Emergency Department (HOSPITAL_BASED_OUTPATIENT_CLINIC_OR_DEPARTMENT_OTHER): Payer: Medicaid Other

## 2018-10-28 DIAGNOSIS — R5383 Other fatigue: Secondary | ICD-10-CM

## 2018-10-28 DIAGNOSIS — Z79899 Other long term (current) drug therapy: Secondary | ICD-10-CM | POA: Insufficient documentation

## 2018-10-28 DIAGNOSIS — F17210 Nicotine dependence, cigarettes, uncomplicated: Secondary | ICD-10-CM | POA: Diagnosis not present

## 2018-10-28 DIAGNOSIS — R110 Nausea: Secondary | ICD-10-CM | POA: Insufficient documentation

## 2018-10-28 LAB — CBC WITH DIFFERENTIAL/PLATELET
Abs Immature Granulocytes: 0.01 10*3/uL (ref 0.00–0.07)
Basophils Absolute: 0 10*3/uL (ref 0.0–0.1)
Basophils Relative: 1 %
Eosinophils Absolute: 0.1 10*3/uL (ref 0.0–0.5)
Eosinophils Relative: 1 %
HCT: 47.8 % — ABNORMAL HIGH (ref 36.0–46.0)
Hemoglobin: 15.9 g/dL — ABNORMAL HIGH (ref 12.0–15.0)
Immature Granulocytes: 0 %
Lymphocytes Relative: 36 %
Lymphs Abs: 2.1 10*3/uL (ref 0.7–4.0)
MCH: 30.6 pg (ref 26.0–34.0)
MCHC: 33.3 g/dL (ref 30.0–36.0)
MCV: 92.1 fL (ref 80.0–100.0)
Monocytes Absolute: 0.3 10*3/uL (ref 0.1–1.0)
Monocytes Relative: 6 %
Neutro Abs: 3.3 10*3/uL (ref 1.7–7.7)
Neutrophils Relative %: 56 %
Platelets: 218 10*3/uL (ref 150–400)
RBC: 5.19 MIL/uL — ABNORMAL HIGH (ref 3.87–5.11)
RDW: 12.6 % (ref 11.5–15.5)
WBC: 5.8 10*3/uL (ref 4.0–10.5)
nRBC: 0.3 % — ABNORMAL HIGH (ref 0.0–0.2)

## 2018-10-28 LAB — COMPREHENSIVE METABOLIC PANEL
ALT: 27 U/L (ref 0–44)
AST: 26 U/L (ref 15–41)
Albumin: 4.2 g/dL (ref 3.5–5.0)
Alkaline Phosphatase: 130 U/L — ABNORMAL HIGH (ref 38–126)
Anion gap: 13 (ref 5–15)
BUN: 10 mg/dL (ref 6–20)
CO2: 18 mmol/L — ABNORMAL LOW (ref 22–32)
Calcium: 9.2 mg/dL (ref 8.9–10.3)
Chloride: 105 mmol/L (ref 98–111)
Creatinine, Ser: 0.7 mg/dL (ref 0.44–1.00)
GFR calc Af Amer: >60 mL/min (ref >60)
GFR calc non Af Amer: >60 mL/min (ref >60)
Glucose, Bld: 78 mg/dL (ref 70–99)
Potassium: 4 mmol/L (ref 3.5–5.1)
Sodium: 136 mmol/L (ref 135–145)
Total Bilirubin: 1.5 mg/dL — ABNORMAL HIGH (ref 0.3–1.2)
Total Protein: 7.8 g/dL (ref 6.5–8.1)

## 2018-10-28 LAB — URINALYSIS, ROUTINE W REFLEX MICROSCOPIC
Bilirubin Urine: NEGATIVE
Glucose, UA: NEGATIVE mg/dL
Ketones, ur: NEGATIVE mg/dL
Leukocytes,Ua: NEGATIVE
Nitrite: NEGATIVE
Protein, ur: NEGATIVE mg/dL
Specific Gravity, Urine: 1.02 (ref 1.005–1.030)
pH: 6 (ref 5.0–8.0)

## 2018-10-28 LAB — URINALYSIS, MICROSCOPIC (REFLEX)

## 2018-10-28 LAB — CBG MONITORING, ED: Glucose-Capillary: 84 mg/dL (ref 70–99)

## 2018-10-28 LAB — PREGNANCY, URINE: Preg Test, Ur: NEGATIVE

## 2018-10-28 MED ORDER — ONDANSETRON 4 MG PO TBDP: 4.0000 mg | ORAL_TABLET | Freq: Three times a day (TID) | ORAL | 1 refills | Status: DC | PRN

## 2018-10-28 NOTE — ED Triage Notes (Addendum)
Fatigue, nausea x 3 days. Denies lack of taste or smell at triage. States her kids have had colds and may have given her a cold. Denies cold symptoms.

## 2018-10-28 NOTE — Discharge Instructions (Signed)
Take the Zofran as needed for the nausea.  Return for any new or worse symptoms.  Today's work-up to include blood work and chest x-ray and EKG without any explanation for the nausea or the fatigue.  May be a viral illness working on you.

## 2018-10-28 NOTE — ED Provider Notes (Signed)
[place] EMERGENCY DEPARTMENT Provider Note   CSN: [id number] Arrival date & time: 10/28/18  1406     History   Chief Complaint Chief Complaint  Patient presents with  . Fatigue    HPI Taylor Clark is a 38 y.o. female.     Patient with a complaint of fatigue and nausea for 3 days.  No significant abdominal pain.  No upper respiratory symptoms.  No fever.  No dysuria.  No vomiting no diarrhea.  No rash.     Past Medical History:  Diagnosis Date  . Anemia     There are no active problems to display for this patient.   Past Surgical History:  Procedure Laterality Date  . CESAREAN SECTION    . HIP SURGERY  2011  . JOINT REPLACEMENT    . WRIST SURGERY       OB History   No obstetric history on file.      Home Medications    Prior to Admission medications   Medication Sig Start Date End Date Taking? Authorizing Provider  benzonatate (TESSALON) 100 MG capsule Take 1 capsule (100 mg total) by mouth 3 (three) times daily as needed for cough. 11/12/17   Gareth Morgan, MD  diazepam (VALIUM) 5 MG tablet Take 1 tablet (5 mg total) by mouth every 12 (twelve) hours as needed for anxiety. 09/28/18   Khatri, Hina, PA-C  fluticasone (FLONASE) 50 MCG/ACT nasal spray Place 1 spray into both nostrils daily. 01/28/17   Khatri, Hina, PA-C  HYDROcodone-acetaminophen (NORCO/VICODIN) 5-325 MG tablet Take 1 tablet by mouth every 4 (four) hours as needed. 10/24/18   Isla Pence, MD  ibuprofen (ADVIL) 600 MG tablet Take 1 tablet (600 mg total) by mouth every 6 (six) hours as needed. 10/24/18   Isla Pence, MD  loratadine (CLARITIN) 10 MG tablet Take 1 tablet (10 mg total) by mouth daily. 05/13/17   Horton, Barbette Hair, MD  naproxen (NAPROSYN) 500 MG tablet Take 1 tablet (500 mg total) by mouth 2 (two) times daily. 09/28/18   Khatri, Hina, PA-C  ondansetron (ZOFRAN ODT) 4 MG disintegrating tablet Take 1 tablet (4 mg total) by mouth every 8 (eight) hours as needed for  nausea or vomiting. 11/12/17   Gareth Morgan, MD  ondansetron (ZOFRAN ODT) 4 MG disintegrating tablet Take 1 tablet (4 mg total) by mouth every 8 (eight) hours as needed. 10/28/18   Fredia Sorrow, MD    Family History Family History  Problem Relation Age of Onset  . Non-Hodgkin's lymphoma Mother   . HIV/AIDS Father        died at age 26  . Anxiety disorder Sister   . Diabetes Maternal Grandmother   . Hypertension Maternal Grandfather   . Diabetes Paternal Grandmother   . Kidney failure Paternal Grandmother     Social History Social History   Tobacco Use  . Smoking status: Current Some Day Smoker    Packs/day: 0.50    Types: Cigarettes  . Smokeless tobacco: Never Used  Substance Use Topics  . Alcohol use: No  . Drug use: No     Allergies   Morphine sulfate   Review of Systems Review of Systems  Constitutional: Positive for fatigue. Negative for chills and fever.  HENT: Negative for congestion, rhinorrhea and sore throat.   Eyes: Negative for visual disturbance.  Respiratory: Negative for cough and shortness of breath.   Cardiovascular: Negative for chest pain and leg swelling.  Gastrointestinal: Positive for nausea. Negative for  abdominal pain, diarrhea and vomiting.  Genitourinary: Negative for dysuria.  Musculoskeletal: Negative for back pain and neck pain.  Skin: Negative for rash.  Neurological: Negative for dizziness, light-headedness and headaches.  Hematological: Does not bruise/bleed easily.  Psychiatric/Behavioral: Negative for confusion.     Physical Exam Updated Vital Signs BP 111/75 (BP Location: Right Arm)   Pulse 76   Temp 97.8 F (36.6 C) (Oral)   Resp 14   Ht 1.549 m (5' 1" )   Wt 87.5 kg   LMP 10/05/2018 (Approximate)   SpO2 100%   BMI 36.45 kg/m   Physical Exam Vitals signs and nursing note reviewed.  Constitutional:      General: She is not in acute distress.    Appearance: Normal appearance. She is well-developed. She is not  ill-appearing.  HENT:     Head: Normocephalic and atraumatic.  Eyes:     Extraocular Movements: Extraocular movements intact.     Conjunctiva/sclera: Conjunctivae normal.     Pupils: Pupils are equal, round, and reactive to light.  Neck:     Musculoskeletal: Normal range of motion and neck supple.  Cardiovascular:     Rate and Rhythm: Normal rate and regular rhythm.     Heart sounds: No murmur.  Pulmonary:     Effort: Pulmonary effort is normal. No respiratory distress.     Breath sounds: Normal breath sounds.  Abdominal:     Palpations: Abdomen is soft.     Tenderness: There is no abdominal tenderness.  Musculoskeletal: Normal range of motion.  Skin:    General: Skin is warm and dry.     Capillary Refill: Capillary refill takes less than 2 seconds.  Neurological:     General: No focal deficit present.     Mental Status: She is alert and oriented to person, place, and time.      ED Treatments / Results  Labs (all labs ordered are listed, but only abnormal results are displayed) Labs Reviewed  URINALYSIS, ROUTINE W REFLEX MICROSCOPIC - Abnormal; Notable for the following components:      Result Value   Hgb urine dipstick TRACE (*)    All other components within normal limits  URINALYSIS, MICROSCOPIC (REFLEX) - Abnormal; Notable for the following components:   Bacteria, UA FEW (*)    All other components within normal limits  CBC WITH DIFFERENTIAL/PLATELET - Abnormal; Notable for the following components:   RBC 5.19 (*)    Hemoglobin 15.9 (*)    HCT 47.8 (*)    nRBC 0.3 (*)    All other components within normal limits  COMPREHENSIVE METABOLIC PANEL - Abnormal; Notable for the following components:   CO2 18 (*)    Alkaline Phosphatase 130 (*)    Total Bilirubin 1.5 (*)    All other components within normal limits  PREGNANCY, URINE  CBG MONITORING, ED  CBG MONITORING, ED    EKG EKG Interpretation  Date/Time:  Tuesday October 28 2018 16:38:58 EDT Ventricular  Rate:  78 PR Interval:    QRS Duration: 76 QT Interval:  382 QTC Calculation: 436 R Axis:   91 Text Interpretation:  Sinus rhythm Borderline right axis deviation No significant change since last tracing Confirmed by Fredia Sorrow [phone]) on 10/28/2018 4:51:58 PM   Radiology Dg Chest 2 View  Result Date: 10/28/2018 CLINICAL DATA:  Fatigue EXAM: CHEST - 2 VIEW COMPARISON:  November 12, 2017 FINDINGS: The heart size and mediastinal contours are within normal limits. Both lungs are clear. The visualized  skeletal structures are unremarkable. IMPRESSION: No active cardiopulmonary disease. Electronically Signed   By: Prudencio Pair M.D.   On: 10/28/2018 17:20    Procedures Procedures (including critical care time)  Medications Ordered in ED Medications - No data to display   Initial Impression / Assessment and Plan / ED Course  I have reviewed the triage vital signs and the nursing notes.  Pertinent labs & imaging results that were available during my care of the patient were reviewed by me and considered in my medical decision making (see chart for details).       General work-up for the symptoms of fatigue and nausea without any acute findings.  Chest x-ray negative.  Urinalysis negative pregnancy test negative labs without significant abnormalities.  EKG without any acute changes.  Liver function test with an elevated bilirubin.  The patient does have a bilirubin in 2017.  But this time has a mild elevation in alk phos at 130.  No abdominal tenderness.  Gallbladder disease not completely ruled out.  We will have patient follow-up with primary care doctor will have patient return for development of any pain in that area.  Will treat patient with Zofran for the nausea.  Final Clinical Impressions(s) / ED Diagnoses   Final diagnoses:  Fatigue, unspecified type  Nausea    ED Discharge Orders         Ordered    ondansetron (ZOFRAN ODT) 4 MG disintegrating tablet  Every 8 hours PRN      10/28/18 1744           Fredia Sorrow, MD 10/28/18 [id number]

## 2018-10-28 NOTE — ED Notes (Signed)
Pt on monitor 

## 2018-11-10 ENCOUNTER — Emergency Department (HOSPITAL_BASED_OUTPATIENT_CLINIC_OR_DEPARTMENT_OTHER)
Admission: EM | Admit: 2018-11-10 | Discharge: 2018-11-10 | Disposition: A | Discharge status: Home / Self Care | Payer: Medicaid Other | Attending: Emergency Medicine | Admitting: Emergency Medicine

## 2018-11-10 ENCOUNTER — Encounter (HOSPITAL_BASED_OUTPATIENT_CLINIC_OR_DEPARTMENT_OTHER): Payer: Self-pay | Admitting: Emergency Medicine

## 2018-11-10 ENCOUNTER — Other Ambulatory Visit: Payer: Self-pay

## 2018-11-10 DIAGNOSIS — J3489 Other specified disorders of nose and nasal sinuses: Secondary | ICD-10-CM | POA: Diagnosis not present

## 2018-11-10 DIAGNOSIS — F17210 Nicotine dependence, cigarettes, uncomplicated: Secondary | ICD-10-CM | POA: Diagnosis not present

## 2018-11-10 DIAGNOSIS — R519 Headache, unspecified: Secondary | ICD-10-CM

## 2018-11-10 DIAGNOSIS — J069 Acute upper respiratory infection, unspecified: Secondary | ICD-10-CM

## 2018-11-10 MED ORDER — KETOROLAC TROMETHAMINE 30 MG/ML IJ SOLN
30.0000 mg | Freq: Once | INTRAMUSCULAR | Status: AC
  Administered 2018-11-10: 30 mg via INTRAVENOUS
  Filled 2018-11-10: qty 1

## 2018-11-10 MED ORDER — FLUTICASONE PROPIONATE 50 MCG/ACT NA SUSP: 2.0000 | Freq: Every day | NASAL | 0 refills | Status: DC

## 2018-11-10 MED ORDER — DIPHENHYDRAMINE HCL 50 MG/ML IJ SOLN
25.0000 mg | Freq: Once | INTRAMUSCULAR | Status: AC
  Administered 2018-11-10: 12.5 mg via INTRAVENOUS
  Filled 2018-11-10: qty 1

## 2018-11-10 MED ORDER — NICOTINE 21 MG/24HR TD PT24: 21.0000 mg | MEDICATED_PATCH | Freq: Once | TRANSDERMAL | Status: DC

## 2018-11-10 MED ORDER — PROCHLORPERAZINE EDISYLATE 10 MG/2ML IJ SOLN
10.0000 mg | Freq: Once | INTRAMUSCULAR | Status: AC
  Administered 2018-11-10: 10 mg via INTRAVENOUS
  Filled 2018-11-10: qty 2

## 2018-11-10 NOTE — ED Triage Notes (Signed)
Endorses headache, facial pain, sore throat for four days. OTC tylenol and aleve not effective, tried tylenol PM tonight with no relief.

## 2018-11-10 NOTE — Discharge Instructions (Addendum)
You were seen today for sinus have a headache and upper respiratory infection.  This is likely viral in nature.  Continue Flonase.  Use an over-the-counter nasal congestion.  No indication for antibiotics at this time.  If continued symptoms after 1 week, have reevaluation by your primary physician.

## 2018-11-10 NOTE — ED Provider Notes (Signed)
[facility] EMERGENCY DEPARTMENT Provider Note   CSN: [id number] Arrival date & time: 11/10/18  8657     History   Chief Complaint Chief Complaint  Patient presents with  . Headache    HPI Taylor Clark is a 38 y.o. female.     HPI  This a 38 year old female who presents with congestion and headache.  Patient reports 3 to 4-day history of worsening frontal headache and nasal congestion.  She also reports some sore throat.  She describes as scratchy.  She reports dry cough.  No fevers.  No known sick contacts or COVID exposures.  Patient states "I have taken everything over-the-counter and nothing is helping."  She reports taking ibuprofen, Tylenol, NyQuil, Flonase.  She states that her headache is mostly frontal region and across her cheeks.  She denies any nausea, vomiting, shortness of breath, abdominal pain.  Past Medical History:  Diagnosis Date  . Anemia     There are no active problems to display for this patient.   Past Surgical History:  Procedure Laterality Date  . CESAREAN SECTION    . HIP SURGERY  2011  . JOINT REPLACEMENT    . WRIST SURGERY       OB History   No obstetric history on file.      Home Medications    Prior to Admission medications   Medication Sig Start Date End Date Taking? Authorizing Provider  benzonatate (TESSALON) 100 MG capsule Take 1 capsule (100 mg total) by mouth 3 (three) times daily as needed for cough. 11/12/17   Alvira Monday, MD  diazepam (VALIUM) 5 MG tablet Take 1 tablet (5 mg total) by mouth every 12 (twelve) hours as needed for anxiety. 09/28/18   Khatri, Hina, PA-C  fluticasone (FLONASE) 50 MCG/ACT nasal spray Place 1 spray into both nostrils daily. 01/28/17   Khatri, Hina, PA-C  fluticasone (FLONASE) 50 MCG/ACT nasal spray Place 2 sprays into both nostrils daily. 11/10/18   Benn Tarver, Mayer Masker, MD  HYDROcodone-acetaminophen (NORCO/VICODIN) 5-325 MG tablet Take 1 tablet by mouth every 4 (four) hours as  needed. 10/24/18   Jacalyn Lefevre, MD  ibuprofen (ADVIL) 600 MG tablet Take 1 tablet (600 mg total) by mouth every 6 (six) hours as needed. 10/24/18   Jacalyn Lefevre, MD  loratadine (CLARITIN) 10 MG tablet Take 1 tablet (10 mg total) by mouth daily. 05/13/17   Graceson Nichelson, Mayer Masker, MD  naproxen (NAPROSYN) 500 MG tablet Take 1 tablet (500 mg total) by mouth 2 (two) times daily. 09/28/18   Khatri, Hina, PA-C  ondansetron (ZOFRAN ODT) 4 MG disintegrating tablet Take 1 tablet (4 mg total) by mouth every 8 (eight) hours as needed for nausea or vomiting. 11/12/17   Alvira Monday, MD  ondansetron (ZOFRAN ODT) 4 MG disintegrating tablet Take 1 tablet (4 mg total) by mouth every 8 (eight) hours as needed. 10/28/18   Vanetta Mulders, MD    Family History Family History  Problem Relation Age of Onset  . Non-Hodgkin's lymphoma Mother   . HIV/AIDS Father        died at age 63  . Anxiety disorder Sister   . Diabetes Maternal Grandmother   . Hypertension Maternal Grandfather   . Diabetes Paternal Grandmother   . Kidney failure Paternal Grandmother     Social History Social History   Tobacco Use  . Smoking status: Current Some Day Smoker    Packs/day: 0.50    Types: Cigarettes  . Smokeless tobacco: Never Used  Substance Use Topics  . Alcohol use: No  . Drug use: No     Allergies   Morphine sulfate   Review of Systems Review of Systems  Constitutional: Negative for fever.  HENT: Positive for congestion, sinus pressure and sore throat. Negative for trouble swallowing.   Respiratory: Positive for cough. Negative for shortness of breath.   Cardiovascular: Negative for chest pain.  Gastrointestinal: Negative for abdominal pain, nausea and vomiting.  Genitourinary: Negative for dysuria.  All other systems reviewed and are negative.    Physical Exam Updated Vital Signs BP 127/80   Pulse 72   Temp 97.7 F (36.5 C) (Oral)   Resp 20   Ht 1.524 m (5')   Wt 81.6 kg   LMP 11/07/2018  (Exact Date)   SpO2 100%   BMI 35.15 kg/m   Physical Exam Vitals signs and nursing note reviewed.  Constitutional:      Appearance: She is well-developed. She is not ill-appearing.  HENT:     Head: Normocephalic and atraumatic.     Comments: Tenderness to palpation over the frontal and maxillary sinuses    Mouth/Throat:     Mouth: Mucous membranes are moist.     Pharynx: Oropharynx is clear.     Comments: No tonsillar exudate or swelling Eyes:     Pupils: Pupils are equal, round, and reactive to light.  Neck:     Musculoskeletal: Neck supple.  Cardiovascular:     Rate and Rhythm: Normal rate and regular rhythm.     Heart sounds: Normal heart sounds.  Pulmonary:     Effort: Pulmonary effort is normal. No respiratory distress.     Breath sounds: No wheezing.  Abdominal:     General: Bowel sounds are normal.     Palpations: Abdomen is soft.  Musculoskeletal:        General: No deformity.  Skin:    General: Skin is warm and dry.  Neurological:     Mental Status: She is alert and oriented to person, place, and time.     Comments: Cranial nerves II through XII intact, 5 out of 5 strength in all 4 extremities, no dysmetria to finger-nose-finger  Psychiatric:        Mood and Affect: Mood normal.      ED Treatments / Results  Labs (all labs ordered are listed, but only abnormal results are displayed) Labs Reviewed - No data to display  EKG None  Radiology No results found.  Procedures Procedures (including critical care time)  Medications Ordered in ED Medications  diphenhydrAMINE (BENADRYL) injection 25 mg (12.5 mg Intravenous Given 11/10/18 0529)  ketorolac (TORADOL) 30 MG/ML injection 30 mg (30 mg Intravenous Given 11/10/18 0528)  prochlorperazine (COMPAZINE) injection 10 mg (10 mg Intravenous Given 11/10/18 0531)     Initial Impression / Assessment and Plan / ED Course  I have reviewed the triage vital signs and the nursing notes.  Pertinent labs & imaging  results that were available during my care of the patient were reviewed by me and considered in my medical decision making (see chart for details).        Patient presents with headache and upper respiratory symptoms.  She is overall nontoxic and vital signs are reassuring.  Given location of her headache and tenderness over her maxillary frontal sinuses, suspect sinus headache.  She is otherwise afebrile and her neurologic exam is reassuring.  Doubt meningitis or subarachnoid hemorrhage.  Patient was given Benadryl, Compazine, and Toradol.  On  recheck, she feels much better.  She does continue to have rhinorrhea and nasal congestion.  Recommend continued supportive measures including Flonase and decongestants at home.  No indication for antibiotics at this time.  If she has prolonged symptoms greater than 1 week, she needs to follow-up with her primary physician for recheck.  At that time she may warrant antibiotics.  This time, feel her symptoms are likely viral in nature.  After history, exam, and medical workup I feel the patient has been appropriately medically screened and is safe for discharge home. Pertinent diagnoses were discussed with the patient. Patient was given return precautions.   Final Clinical Impressions(s) / ED Diagnoses   Final diagnoses:  Sinus headache  Acute URI    ED Discharge Orders         Ordered    fluticasone (FLONASE) 50 MCG/ACT nasal spray  Daily     11/10/18 0630           Merryl Hacker, MD 11/10/18 [phone]

## 2019-05-20 ENCOUNTER — Other Ambulatory Visit: Payer: Self-pay

## 2019-05-20 ENCOUNTER — Encounter (HOSPITAL_BASED_OUTPATIENT_CLINIC_OR_DEPARTMENT_OTHER): Payer: Self-pay

## 2019-05-20 ENCOUNTER — Emergency Department (HOSPITAL_BASED_OUTPATIENT_CLINIC_OR_DEPARTMENT_OTHER)
Admission: EM | Admit: 2019-05-20 | Discharge: 2019-05-20 | Disposition: A | Discharge status: Home / Self Care | Payer: Medicaid Other | Attending: Emergency Medicine | Admitting: Emergency Medicine

## 2019-05-20 DIAGNOSIS — Z885 Allergy status to narcotic agent status: Secondary | ICD-10-CM | POA: Insufficient documentation

## 2019-05-20 DIAGNOSIS — F17210 Nicotine dependence, cigarettes, uncomplicated: Secondary | ICD-10-CM | POA: Diagnosis not present

## 2019-05-20 DIAGNOSIS — N899 Noninflammatory disorder of vagina, unspecified: Secondary | ICD-10-CM | POA: Diagnosis present

## 2019-05-20 DIAGNOSIS — A5900 Urogenital trichomoniasis, unspecified: Secondary | ICD-10-CM | POA: Insufficient documentation

## 2019-05-20 DIAGNOSIS — A599 Trichomoniasis, unspecified: Secondary | ICD-10-CM

## 2019-05-20 DIAGNOSIS — Z79899 Other long term (current) drug therapy: Secondary | ICD-10-CM | POA: Insufficient documentation

## 2019-05-20 LAB — WET PREP, GENITAL
Clue Cells Wet Prep HPF POC: NONE SEEN
Sperm: NONE SEEN
Yeast Wet Prep HPF POC: NONE SEEN

## 2019-05-20 LAB — URINALYSIS, MICROSCOPIC (REFLEX): WBC, UA: >50 WBC/hpf (ref 0–5)

## 2019-05-20 LAB — URINALYSIS, ROUTINE W REFLEX MICROSCOPIC
Bilirubin Urine: NEGATIVE
Glucose, UA: NEGATIVE mg/dL
Hgb urine dipstick: NEGATIVE
Ketones, ur: NEGATIVE mg/dL
Nitrite: NEGATIVE
Protein, ur: NEGATIVE mg/dL
Specific Gravity, Urine: 1.025 (ref 1.005–1.030)
pH: 6 (ref 5.0–8.0)

## 2019-05-20 LAB — PREGNANCY, URINE: Preg Test, Ur: NEGATIVE

## 2019-05-20 MED ORDER — METRONIDAZOLE 500 MG PO TABS
2000.0000 mg | ORAL_TABLET | Freq: Once | ORAL | Status: AC
  Administered 2019-05-20: 2000 mg via ORAL
  Filled 2019-05-20: qty 4

## 2019-05-20 NOTE — ED Provider Notes (Signed)
[facility] EMERGENCY DEPARTMENT Provider Note   CSN: [id number] Arrival date & time: 05/20/19  2059     History Chief Complaint  Patient presents with  . Vaginal Discharge     HPI   Blood pressure 126/80, pulse 69, temperature 98.4 F (36.9 C), temperature source Oral, resp. rate 16, height 5' (1.524 m), weight 83.9 kg, last menstrual period 05/06/2019, SpO2 97 %.  Taylor Clark is a 39 y.o. female complaining of 4 days of foul-smelling profuse vaginal discharge with some mild lower abdominal discomfort no fever, chills, nausea, vomiting, rash.     Past Medical History:  Diagnosis Date  . Anemia     There are no problems to display for this patient.   Past Surgical History:  Procedure Laterality Date  . CESAREAN SECTION    . HIP SURGERY  2011  . JOINT REPLACEMENT    . WRIST SURGERY       OB History   No obstetric history on file.     Family History  Problem Relation Age of Onset  . Non-Hodgkin's lymphoma Mother   . HIV/AIDS Father        died at age 46  . Anxiety disorder Sister   . Diabetes Maternal Grandmother   . Hypertension Maternal Grandfather   . Diabetes Paternal Grandmother   . Kidney failure Paternal Grandmother     Social History   Tobacco Use  . Smoking status: Current Some Day Smoker    Packs/day: 0.50    Types: Cigarettes  . Smokeless tobacco: Never Used  Substance Use Topics  . Alcohol use: No  . Drug use: No    Home Medications Prior to Admission medications   Medication Sig Start Date End Date Taking? Authorizing Provider  benzonatate (TESSALON) 100 MG capsule Take 1 capsule (100 mg total) by mouth 3 (three) times daily as needed for cough. 11/12/17   Alvira Monday, MD  diazepam (VALIUM) 5 MG tablet Take 1 tablet (5 mg total) by mouth every 12 (twelve) hours as needed for anxiety. 09/28/18   Khatri, Hina, PA-C  fluticasone (FLONASE) 50 MCG/ACT nasal spray Place 1 spray into both nostrils daily. 01/28/17    Khatri, Hina, PA-C  fluticasone (FLONASE) 50 MCG/ACT nasal spray Place 2 sprays into both nostrils daily. 11/10/18   Horton, Mayer Masker, MD  HYDROcodone-acetaminophen (NORCO/VICODIN) 5-325 MG tablet Take 1 tablet by mouth every 4 (four) hours as needed. 10/24/18   Jacalyn Lefevre, MD  ibuprofen (ADVIL) 600 MG tablet Take 1 tablet (600 mg total) by mouth every 6 (six) hours as needed. 10/24/18   Jacalyn Lefevre, MD  loratadine (CLARITIN) 10 MG tablet Take 1 tablet (10 mg total) by mouth daily. 05/13/17   Horton, Mayer Masker, MD  naproxen (NAPROSYN) 500 MG tablet Take 1 tablet (500 mg total) by mouth 2 (two) times daily. 09/28/18   Khatri, Hina, PA-C  ondansetron (ZOFRAN ODT) 4 MG disintegrating tablet Take 1 tablet (4 mg total) by mouth every 8 (eight) hours as needed for nausea or vomiting. 11/12/17   Alvira Monday, MD  ondansetron (ZOFRAN ODT) 4 MG disintegrating tablet Take 1 tablet (4 mg total) by mouth every 8 (eight) hours as needed. 10/28/18   Vanetta Mulders, MD    Allergies    Morphine sulfate  Review of Systems   Review of Systems   A complete review of systems was obtained and all systems are negative except as noted in the HPI and PMH.    Physical  Exam Updated Vital Signs BP 126/80 (BP Location: Left Arm)   Pulse 69   Temp 98.4 F (36.9 C) (Oral)   Resp 16   Ht 5' (1.524 m)   Wt 83.9 kg   LMP 05/06/2019   SpO2 97%   BMI 36.13 kg/m   Physical Exam Vitals and nursing note reviewed. Exam conducted with a chaperone present.  Constitutional:      General: She is not in acute distress.    Appearance: She is well-developed. She is not diaphoretic.  HENT:     Head: Normocephalic and atraumatic.  Eyes:     Conjunctiva/sclera: Conjunctivae normal.     Pupils: Pupils are equal, round, and reactive to light.  Cardiovascular:     Rate and Rhythm: Normal rate and regular rhythm.  Pulmonary:     Effort: Pulmonary effort is normal.     Breath sounds: Normal breath sounds.    Abdominal:     Palpations: Abdomen is soft.     Tenderness: There is no abdominal tenderness.  Genitourinary:    Comments: Green profuse vaginal discharge no cervical motion or adnexal tenderness. Musculoskeletal:        General: Normal range of motion.     Cervical back: Normal range of motion.  Neurological:     Mental Status: She is alert and oriented to person, place, and time.     ED Results / Procedures / Treatments   Labs (all labs ordered are listed, but only abnormal results are displayed) Labs Reviewed  WET PREP, GENITAL - Abnormal; Notable for the following components:      Result Value   Trich, Wet Prep PRESENT (*)    WBC, Wet Prep HPF POC FEW (*)    All other components within normal limits  URINALYSIS, ROUTINE W REFLEX MICROSCOPIC - Abnormal; Notable for the following components:   APPearance HAZY (*)    Leukocytes,Ua MODERATE (*)    All other components within normal limits  URINALYSIS, MICROSCOPIC (REFLEX) - Abnormal; Notable for the following components:   Bacteria, UA MANY (*)    Trichomonas, UA PRESENT (*)    All other components within normal limits  PREGNANCY, URINE  RPR  HIV ANTIBODY (ROUTINE TESTING W REFLEX)  GC/CHLAMYDIA PROBE AMP ([place]) NOT AT [facility]    EKG None  Radiology No results found.  Procedures Procedures (including critical care time)  Medications Ordered in ED Medications  metroNIDAZOLE (FLAGYL) tablet 2,000 mg (2,000 mg Oral Given 05/20/19 2236)    ED Course  I have reviewed the triage vital signs and the nursing notes.  Pertinent labs & imaging results that were available during my care of the patient were reviewed by me and considered in my medical decision making (see chart for details).    MDM Rules/Calculators/A&P                      Vitals:   05/20/19 2105  BP: 126/80  Pulse: 69  Resp: 16  Temp: 98.4 F (36.9 C)  TempSrc: Oral  SpO2: 97%  Weight: 83.9 kg  Height: 5' (1.524 m)    Medications   metroNIDAZOLE (FLAGYL) tablet 2,000 mg (2,000 mg Oral Given 05/20/19 2236)    Taylor Clark is 39 y.o. female presenting with profuse malodorous vaginal discharge starting 4 days ago.  UA shows trichomoniasis, patient is amenable to full STD screening.  Pelvic exam with no signs of PID.  She understands not to drink any alcohol after  taking Flagyl for several days.  Evaluation does not show pathology that would require ongoing emergent intervention or inpatient treatment. Pt is hemodynamically stable and mentating appropriately. Discussed findings and plan with patient/guardian, who agrees with care plan. All questions answered. Return precautions discussed and outpatient follow up given.     Final Clinical Impression(s) / ED Diagnoses Final diagnoses:  Trichomonas vaginalis infection    Rx / DC Orders ED Discharge Orders    None       Ladanian Kelter, Charna Elizabeth 05/20/19 [place], [place], DO 05/20/19 2251

## 2019-05-20 NOTE — ED Triage Notes (Signed)
Pt c/o vaginal d/c x 1 week-NAD-steady gait 

## 2019-05-20 NOTE — Discharge Instructions (Signed)
Refrain from sex until you have the results of the full STD screen, you will need to tell your partners to be tested or treated for trichomonas.

## 2019-05-21 LAB — RPR: RPR Ser Ql: NONREACTIVE

## 2019-05-21 LAB — HIV ANTIBODY (ROUTINE TESTING W REFLEX): HIV Screen 4th Generation wRfx: NONREACTIVE

## 2019-05-21 LAB — GC/CHLAMYDIA PROBE AMP (~~LOC~~) NOT AT ARMC
Chlamydia: NEGATIVE
Comment: NEGATIVE
Comment: NORMAL
Neisseria Gonorrhea: NEGATIVE

## 2019-06-02 ENCOUNTER — Other Ambulatory Visit: Payer: Self-pay

## 2019-06-02 ENCOUNTER — Emergency Department (HOSPITAL_BASED_OUTPATIENT_CLINIC_OR_DEPARTMENT_OTHER)
Admission: EM | Admit: 2019-06-02 | Discharge: 2019-06-02 | Disposition: A | Discharge status: Home / Self Care | Payer: Medicaid Other | Attending: Emergency Medicine | Admitting: Emergency Medicine

## 2019-06-02 ENCOUNTER — Encounter (HOSPITAL_BASED_OUTPATIENT_CLINIC_OR_DEPARTMENT_OTHER): Payer: Self-pay | Admitting: *Deleted

## 2019-06-02 DIAGNOSIS — R2241 Localized swelling, mass and lump, right lower limb: Secondary | ICD-10-CM | POA: Diagnosis present

## 2019-06-02 DIAGNOSIS — F17210 Nicotine dependence, cigarettes, uncomplicated: Secondary | ICD-10-CM | POA: Diagnosis not present

## 2019-06-02 DIAGNOSIS — L02214 Cutaneous abscess of groin: Secondary | ICD-10-CM | POA: Insufficient documentation

## 2019-06-02 MED ORDER — LIDOCAINE-EPINEPHRINE 2 %-1:100000 IJ SOLN: 20.0000 mL | Freq: Once | INTRAMUSCULAR | Status: DC

## 2019-06-02 MED ORDER — DOXYCYCLINE HYCLATE 100 MG PO CAPS
100.0000 mg | ORAL_CAPSULE | Freq: Two times a day (BID) | ORAL | 0 refills | Status: DC
Start: 2019-06-02 — End: 2020-04-12

## 2019-06-02 MED ORDER — KETOROLAC TROMETHAMINE 15 MG/ML IJ SOLN
30.0000 mg | Freq: Once | INTRAMUSCULAR | Status: AC
  Administered 2019-06-02: 30 mg via INTRAMUSCULAR
  Filled 2019-06-02: qty 2

## 2019-06-02 MED ORDER — LIDOCAINE HCL (PF) 1 % IJ SOLN
INTRAMUSCULAR | Status: AC
  Filled 2019-06-02: qty 5

## 2019-06-02 MED ORDER — HYDROCODONE-ACETAMINOPHEN 5-325 MG PO TABS: 1.0000 | ORAL_TABLET | Freq: Four times a day (QID) | ORAL | 0 refills | Status: DC | PRN

## 2019-06-02 MED ORDER — LIDOCAINE-EPINEPHRINE 1 %-1:100000 IJ SOLN
INTRAMUSCULAR | Status: AC
  Administered 2019-06-02: 1 mL
  Filled 2019-06-02: qty 1

## 2019-06-02 MED FILL — DOXYCYCLINE HYCLATE 100 MG: 100 | 7 days supply | Qty: 14 | Fill #0

## 2019-06-02 MED FILL — HYDROCODON-APAP 5-325: 5-325 | 1 days supply | Qty: 4 | Fill #0

## 2019-06-02 NOTE — ED Triage Notes (Signed)
Abscess to right bikini line 3 to 4 days.

## 2019-06-02 NOTE — ED Notes (Signed)
ED Provider at bedside. 

## 2019-06-02 NOTE — ED Provider Notes (Signed)
[facility] EMERGENCY DEPARTMENT Provider Note   CSN: [id number] Arrival date & time: 06/02/19  0750     History Chief Complaint  Patient presents with  . Abscess    Taylor Clark is a 39 y.o. female.  The history is provided by the patient and medical records. No language interpreter was used.  Abscess  Taylor Clark is a 39 y.o. female who presents to the Emergency Department complaining of abscess. She presents the emergency department complaining of abscess to her right inguinal region. Symptoms began about 3 to 4 days ago with local swelling and pain. The area significantly worsened today. She denies any fevers, nausea, vomiting, dysuria. She has experienced similar episodes in the past in her groin. She denies any chance of pregnancy. No history of diabetes.    Past Medical History:  Diagnosis Date  . Anemia     There are no problems to display for this patient.   Past Surgical History:  Procedure Laterality Date  . CESAREAN SECTION    . HIP SURGERY  2011  . JOINT REPLACEMENT    . WRIST SURGERY       OB History    Gravida  5   Para  3   Term      Preterm      AB  2   Living        SAB      TAB      Ectopic      Multiple      Live Births              Family History  Problem Relation Age of Onset  . Non-Hodgkin's lymphoma Mother   . HIV/AIDS Father        died at age 73  . Anxiety disorder Sister   . Diabetes Maternal Grandmother   . Hypertension Maternal Grandfather   . Diabetes Paternal Grandmother   . Kidney failure Paternal Grandmother     Social History   Tobacco Use  . Smoking status: Current Some Day Smoker    Packs/day: 0.50    Types: Cigarettes  . Smokeless tobacco: Never Used  Substance Use Topics  . Alcohol use: No  . Drug use: No    Home Medications Prior to Admission medications   Medication Sig Start Date End Date Taking? Authorizing Provider  doxycycline (VIBRAMYCIN) 100 MG capsule  Take 1 capsule (100 mg total) by mouth 2 (two) times daily. 06/02/19   Tilden Fossa, MD  HYDROcodone-acetaminophen (NORCO/VICODIN) 5-325 MG tablet Take 1 tablet by mouth every 6 (six) hours as needed. 06/02/19   Tilden Fossa, MD    Allergies    Morphine sulfate  Review of Systems   Review of Systems  All other systems reviewed and are negative.   Physical Exam Updated Vital Signs BP 122/80 (BP Location: Right Arm)   Pulse 84   Temp 98.1 F (36.7 C) (Oral)   Resp 18   Ht 5' (1.524 m)   Wt 82.1 kg   LMP 05/06/2019   SpO2 99%   BMI 35.35 kg/m   Physical Exam Vitals and nursing note reviewed.  Constitutional:      Appearance: She is well-developed.  HENT:     Head: Normocephalic and atraumatic.  Cardiovascular:     Rate and Rhythm: Normal rate and regular rhythm.     Heart sounds: No murmur.  Pulmonary:     Effort: Pulmonary effort is normal. No respiratory distress.  Breath sounds: Normal breath sounds.  Abdominal:     Palpations: Abdomen is soft.     Tenderness: There is no abdominal tenderness. There is no guarding or rebound.  Musculoskeletal:        General: Tenderness present.     Comments: There is a 2 to 3 cm area of focal fluctuance and swelling to the right inguinal crease with mild to moderate surrounding edema. There is central pointing.  Skin:    General: Skin is warm and dry.  Neurological:     Mental Status: She is alert and oriented to person, place, and time.  Psychiatric:        Behavior: Behavior normal.     ED Results / Procedures / Treatments   Labs (all labs ordered are listed, but only abnormal results are displayed) Labs Reviewed - No data to display  EKG None  Radiology No results found.  Procedures .Marland KitchenIncision and Drainage  Date/Time: 06/02/2019 8:53 AM Performed by: Tilden Fossa, MD Authorized by: Tilden Fossa, MD   Consent:    Consent obtained:  Verbal   Consent given by:  Patient   Risks discussed:   Bleeding, incomplete drainage and pain Location:    Type:  Abscess   Location:  Anogenital   Anogenital location: right inguinal crease. Pre-procedure details:    Skin preparation:  Chloraprep Anesthesia (see MAR for exact dosages):    Anesthesia method:  Local infiltration   Local anesthetic:  Lidocaine 2% WITH epi Procedure type:    Complexity:  Complex Procedure details:    Incision types:  Single straight   Scalpel blade:  11   Wound management:  Probed and deloculated and irrigated with saline   Drainage:  Purulent   Drainage amount:  Copious   Wound treatment:  Wound left open   Packing materials:  None Post-procedure details:    Patient tolerance of procedure:  Tolerated well, no immediate complications   (including critical care time)  Medications Ordered in ED Medications  lidocaine-EPINEPHrine (XYLOCAINE W/EPI) 2 %-1:100000 (with pres) injection 20 mL (has no administration in time range)  ketorolac (TORADOL) 15 MG/ML injection 30 mg (30 mg Intramuscular Given 06/02/19 0834)  lidocaine-EPINEPHrine (XYLOCAINE W/EPI) 1 %-1:100000 (with pres) injection (1 mL  Given by Other 06/02/19 4037)    ED Course  I have reviewed the triage vital signs and the nursing notes.  Pertinent labs & imaging results that were available during my care of the patient were reviewed by me and considered in my medical decision making (see chart for details).    MDM Rules/Calculators/A&P                     Patient here for evaluation of right inguinal swelling and pain. She has abscess on examination. I&D performed per procedure note. There did appear to be surrounding edema concerning for local cellulitis, will start on antibiotics given the local inflammation. Presentation is not consistent with sepsis or necrotizing soft tissue infection. Discussed with patient home care for abscess. Discussed outpatient follow-up and return precautions.  Final Clinical Impression(s) / ED Diagnoses Final  diagnoses:  Soft tissue abscess of inguinal region    Rx / DC Orders ED Discharge Orders         Ordered    doxycycline (VIBRAMYCIN) 100 MG capsule  2 times daily     06/02/19 0853    HYDROcodone-acetaminophen (NORCO/VICODIN) 5-325 MG tablet  Every 6 hours PRN     06/02/19 0853  Quintella Reichert, MD 06/02/19 [phone]

## 2019-09-29 ENCOUNTER — Other Ambulatory Visit: Payer: Self-pay

## 2019-09-29 ENCOUNTER — Encounter (HOSPITAL_BASED_OUTPATIENT_CLINIC_OR_DEPARTMENT_OTHER): Payer: Self-pay | Admitting: *Deleted

## 2019-09-29 ENCOUNTER — Emergency Department (HOSPITAL_BASED_OUTPATIENT_CLINIC_OR_DEPARTMENT_OTHER)
Admission: EM | Admit: 2019-09-29 | Discharge: 2019-09-29 | Disposition: A | Discharge status: Home / Self Care | Payer: Medicaid Other | Attending: Emergency Medicine | Admitting: Emergency Medicine

## 2019-09-29 DIAGNOSIS — R21 Rash and other nonspecific skin eruption: Secondary | ICD-10-CM | POA: Insufficient documentation

## 2019-09-29 DIAGNOSIS — J029 Acute pharyngitis, unspecified: Secondary | ICD-10-CM | POA: Diagnosis not present

## 2019-09-29 DIAGNOSIS — F17210 Nicotine dependence, cigarettes, uncomplicated: Secondary | ICD-10-CM | POA: Insufficient documentation

## 2019-09-29 DIAGNOSIS — J020 Streptococcal pharyngitis: Secondary | ICD-10-CM

## 2019-09-29 LAB — GROUP A STREP BY PCR: Group A Strep by PCR: DETECTED — AB

## 2019-09-29 MED ORDER — PENICILLIN G BENZATHINE & PROC 1200000 UNIT/2ML IM SUSP
1.2000 10*6.[IU] | Freq: Once | INTRAMUSCULAR | Status: AC
  Administered 2019-09-29: 1.2 10*6.[IU] via INTRAMUSCULAR
  Filled 2019-09-29: qty 2

## 2019-09-29 MED ORDER — DEXAMETHASONE 6 MG PO TABS
6.0000 mg | ORAL_TABLET | Freq: Once | ORAL | Status: AC
  Administered 2019-09-29: 6 mg via ORAL
  Filled 2019-09-29: qty 1

## 2019-09-29 NOTE — ED Triage Notes (Signed)
Sore throat and rash that looks like dry skin.

## 2019-09-29 NOTE — Discharge Instructions (Addendum)
You were seen in the emergency department and diagnosed with strep throat.  You were given a shot of Penicillin and a pill  of Decadron.  - Penicillin is an antibiotic used to treat the infection - Decadron is a steroid used to treat the pain and swelling of your throat.   You should gradually feel better over the next few days. Take Tylenol and Ibuprofen for fever and pain. Follow up with your primary care provider in the next 1 week if you are not feeling better, if you do not have a primary care provider one is provided in your discharge instructions. Return to the emergency department for any new or worsening symptoms including but not limited to inability to open your mouth, inability to move your neck, worsening pain, change in your voice, inability to swallow your own saliva, drooling, or any other concerns.

## 2019-09-29 NOTE — ED Provider Notes (Signed)
[facility] EMERGENCY DEPARTMENT Provider Note   CSN: [id number] Arrival date & time: 09/29/19  4098     History Chief Complaint  Patient presents with   Sore Throat   Rash    Taylor Clark is a 39 y.o. female with medical history significant for anemia.  HPI Patient presents to emergency department today with chief complaint of sore throat x 1 day.  Patient she states when she woke up this morning she had a sore throat.  She describes the pain as scratching sensation when swallowing.  She looked in the mirror and saw white patches on her tonsil.  She took Tylenol which helped her pain.  She rates her pain 8/10 in severity.  She also is reporting a rash on her chest.  She thinks it is dry skin. Rash comes and goes x several months.  No changes in soap, laundry detergent, perfume or lotion, no recent antibiotic use.  She denies any fever, chills, congestion, difficulty breathing, cough, abdominal pain, nausea, vomiting, urinary symptoms, diarrhea.    Past Medical History:  Diagnosis Date   Anemia     There are no problems to display for this patient.   Past Surgical History:  Procedure Laterality Date   CESAREAN SECTION     HIP SURGERY  2011   JOINT REPLACEMENT     WRIST SURGERY       OB History    Gravida  5   Para  3   Term      Preterm      AB  2   Living        SAB      TAB      Ectopic      Multiple      Live Births              Family History  Problem Relation Age of Onset   Non-Hodgkin's lymphoma Mother    HIV/AIDS Father        died at age 76   Anxiety disorder Sister    Diabetes Maternal Grandmother    Hypertension Maternal Grandfather    Diabetes Paternal Grandmother    Kidney failure Paternal Grandmother     Social History   Tobacco Use   Smoking status: Current Some Day Smoker    Packs/day: 0.50    Types: Cigarettes   Smokeless tobacco: Never Used  Vaping Use   Vaping Use: Never used    Substance Use Topics   Alcohol use: No   Drug use: No    Home Medications Prior to Admission medications   Medication Sig Start Date End Date Taking? Authorizing Provider  doxycycline (VIBRAMYCIN) 100 MG capsule Take 1 capsule (100 mg total) by mouth 2 (two) times daily. 06/02/19   Tilden Fossa, MD  HYDROcodone-acetaminophen (NORCO/VICODIN) 5-325 MG tablet Take 1 tablet by mouth every 6 (six) hours as needed. 06/02/19   Tilden Fossa, MD    Allergies    Morphine sulfate  Review of Systems   Review of Systems All other systems are reviewed and are negative for acute change except as noted in the HPI.  Physical Exam Updated Vital Signs BP (!) 124/93    Pulse 87    Temp 99 F (37.2 C) (Oral)    Resp 20    Ht 5' (1.524 m)    Wt 82.1 kg    SpO2 100%    BMI 35.35 kg/m   Physical Exam Vitals and nursing note  reviewed.  Constitutional:      Appearance: She is well-developed. She is not ill-appearing or toxic-appearing.  HENT:     Head: Normocephalic and atraumatic.     Nose: Nose normal.     Mouth/Throat:     Pharynx: Uvula midline. No uvula swelling.     Comments: Minor erythema to oropharynx, no edema, exudate on left tonsil, no tonsillar swelling, voice normal, neck supple without lymphadenopathy  Eyes:     General: No scleral icterus.       Right eye: No discharge.        Left eye: No discharge.     Conjunctiva/sclera: Conjunctivae normal.  Neck:     Vascular: No JVD.  Cardiovascular:     Rate and Rhythm: Normal rate and regular rhythm.     Pulses: Normal pulses.     Heart sounds: Normal heart sounds.  Pulmonary:     Effort: Pulmonary effort is normal.     Breath sounds: Normal breath sounds.  Chest:     Breasts:        Right: No inverted nipple or nipple discharge.   Abdominal:     General: There is no distension.  Musculoskeletal:        General: Normal range of motion.     Cervical back: Normal range of motion.  Skin:    General: Skin is warm and dry.      Comments: Maculopapular rash on right upper chest. No overlying erythema.  No purulent drainage.  No palpable fluctuance.  No gross abscess.  Neurological:     Mental Status: She is oriented to person, place, and time.     GCS: GCS eye subscore is 4. GCS verbal subscore is 5. GCS motor subscore is 6.     Comments: Fluent speech, no facial droop.  Psychiatric:        Behavior: Behavior normal.     ED Results / Procedures / Treatments   Labs (all labs ordered are listed, but only abnormal results are displayed) Labs Reviewed  GROUP A STREP BY PCR - Abnormal; Notable for the following components:      Result Value   Group A Strep by PCR DETECTED (*)    All other components within normal limits    EKG None  Radiology No results found.  Procedures Procedures (including critical care time)  Medications Ordered in ED Medications  dexamethasone (DECADRON) tablet 6 mg (has no administration in time range)  penicillin g procaine-penicillin g benzathine (BICILLIN-CR) injection 600000-600000 units (has no administration in time range)    ED Course  I have reviewed the triage vital signs and the nursing notes.  Pertinent labs & imaging results that were available during my care of the patient were reviewed by me and considered in my medical decision making (see chart for details).    MDM Rules/Calculators/A&P                          History provided by patient with additional history obtained from chart review.    39 yo female who presents with sore throat. Still able to tolerate PO/secretions but with worsening pain. Patient is afebrile, non-toxic appearing, sitting comfortably on examination table. Vital signs reviewed and stable. On exam, she has edema to posterior oropharynx and exudate on left tonsil . Presentation not concerning for PTA or Ludwig's angina, Uvulitis, epiglottitis, peritonsillar abscess, or retropharyngeal abscess.  She does have a rash on her right  upper  chest.  Does not appear infectious.  Looks to be contact irritation or dry skin.  No indications of SJS or TENS. Strep ordered at triage.   Strep reviewed. Positive. Pt treated with IM penicillin, steroids while in the ED. Pt does not appear dehydrated, but did discuss importance of water rehydration.  Encouraged at home supportive care measures. Patient successfully fluid challenged in the ED without difficulty swallowing.  Strict return precautions given. NAD. VSS. Recommended PCP follow up for re-evaluation.    Portions of this note were generated with Scientist, clinical (histocompatibility and immunogenetics). Dictation errors may occur despite best attempts at proofreading.   Final Clinical Impression(s) / ED Diagnoses Final diagnoses:  Strep pharyngitis    Rx / DC Orders ED Discharge Orders    None       Kathyrn Lass 09/29/19 2207    Arby Barrette, MD 10/11/19 [phone]

## 2020-04-12 ENCOUNTER — Encounter (HOSPITAL_BASED_OUTPATIENT_CLINIC_OR_DEPARTMENT_OTHER): Payer: Self-pay | Admitting: Emergency Medicine

## 2020-04-12 ENCOUNTER — Other Ambulatory Visit: Payer: Self-pay

## 2020-04-12 ENCOUNTER — Emergency Department (HOSPITAL_BASED_OUTPATIENT_CLINIC_OR_DEPARTMENT_OTHER)
Admission: EM | Admit: 2020-04-12 | Discharge: 2020-04-12 | Disposition: A | Discharge status: Home / Self Care | Payer: BC Managed Care – PPO | Attending: Emergency Medicine | Admitting: Emergency Medicine

## 2020-04-12 DIAGNOSIS — A599 Trichomoniasis, unspecified: Secondary | ICD-10-CM

## 2020-04-12 DIAGNOSIS — F17210 Nicotine dependence, cigarettes, uncomplicated: Secondary | ICD-10-CM | POA: Insufficient documentation

## 2020-04-12 DIAGNOSIS — L292 Pruritus vulvae: Secondary | ICD-10-CM | POA: Diagnosis present

## 2020-04-12 LAB — URINALYSIS, ROUTINE W REFLEX MICROSCOPIC
Bilirubin Urine: NEGATIVE
Glucose, UA: NEGATIVE mg/dL
Ketones, ur: 15 mg/dL — AB
Nitrite: NEGATIVE
Protein, ur: NEGATIVE mg/dL
Specific Gravity, Urine: 1.025 (ref 1.005–1.030)
pH: 6 (ref 5.0–8.0)

## 2020-04-12 LAB — WET PREP, GENITAL
Sperm: NONE SEEN
Yeast Wet Prep HPF POC: NONE SEEN

## 2020-04-12 LAB — URINALYSIS, MICROSCOPIC (REFLEX): WBC, UA: >50 WBC/hpf (ref 0–5)

## 2020-04-12 LAB — PREGNANCY, URINE: Preg Test, Ur: NEGATIVE

## 2020-04-12 MED ORDER — METRONIDAZOLE 500 MG PO TABS: 500.0000 mg | ORAL_TABLET | Freq: Two times a day (BID) | ORAL | 0 refills | Status: AC

## 2020-04-12 NOTE — ED Provider Notes (Signed)
[facility] EMERGENCY DEPARTMENT Provider Note   CSN: [id number] Arrival date & time: 04/12/20  2028     History Chief Complaint  Patient presents with  . Vaginal Itching    Taylor Clark is a 40 y.o. female who present with vaginal itching x4 days. She tried monistat without relief. She is having some suprapubic pain. She has not been sexually active for the past 3 months. She denies fevers, urinary sxs or back pain. LMP was the second week of February.  HPI     Past Medical History:  Diagnosis Date  . Anemia     There are no problems to display for this patient.   Past Surgical History:  Procedure Laterality Date  . CESAREAN SECTION    . HIP SURGERY  2011  . JOINT REPLACEMENT    . WRIST SURGERY       OB History    Gravida  5   Para  3   Term      Preterm      AB  2   Living        SAB      IAB      Ectopic      Multiple      Live Births              Family History  Problem Relation Age of Onset  . Non-Hodgkin's lymphoma Mother   . HIV/AIDS Father        died at age 93  . Anxiety disorder Sister   . Diabetes Maternal Grandmother   . Hypertension Maternal Grandfather   . Diabetes Paternal Grandmother   . Kidney failure Paternal Grandmother     Social History   Tobacco Use  . Smoking status: Current Some Day Smoker    Packs/day: 0.50    Types: Cigarettes  . Smokeless tobacco: Never Used  Vaping Use  . Vaping Use: Never used  Substance Use Topics  . Alcohol use: No  . Drug use: No    Home Medications Prior to Admission medications   Not on File    Allergies    Morphine sulfate  Review of Systems   Review of Systems  Constitutional: Negative for fever.  Genitourinary: Positive for dysuria, pelvic pain and vaginal discharge. Negative for frequency, hematuria and vaginal bleeding.    Physical Exam Updated Vital Signs BP 120/76 (BP Location: Right Arm)   Pulse 82   Temp 97.8 F (36.6 C) (Oral)    Resp 18   Ht 5' (1.524 m)   Wt 87 kg   SpO2 100%   BMI 37.48 kg/m   Physical Exam Vitals and nursing note reviewed. Exam conducted with a chaperone present.  Constitutional:      General: She is not in acute distress.    Appearance: She is well-developed and well-nourished. She is not diaphoretic.  HENT:     Head: Normocephalic and atraumatic.  Eyes:     General: No scleral icterus.    Conjunctiva/sclera: Conjunctivae normal.  Cardiovascular:     Rate and Rhythm: Normal rate and regular rhythm.     Heart sounds: Normal heart sounds. No murmur heard. No friction rub. No gallop.   Pulmonary:     Effort: Pulmonary effort is normal. No respiratory distress.     Breath sounds: Normal breath sounds.  Abdominal:     General: Bowel sounds are normal. There is no distension.     Palpations: Abdomen is soft. There  is no mass.     Tenderness: There is no abdominal tenderness. There is no guarding.  Genitourinary:    Exam position: Lithotomy position.     Labia:        Right: No rash, tenderness, lesion or injury.        Left: No rash, tenderness, lesion or injury.      Vagina: Vaginal discharge present.     Cervix: Normal.     Adnexa: Right adnexa normal and left adnexa normal.     Comments: Copious, thick white vaginal discharge. Musculoskeletal:     Cervical back: Normal range of motion.  Skin:    General: Skin is warm and dry.  Neurological:     Mental Status: She is alert and oriented to person, place, and time.  Psychiatric:        Behavior: Behavior normal.     ED Results / Procedures / Treatments   Labs (all labs ordered are listed, but only abnormal results are displayed) Labs Reviewed  URINALYSIS, ROUTINE W REFLEX MICROSCOPIC - Abnormal; Notable for the following components:      Result Value   APPearance CLOUDY (*)    Hgb urine dipstick SMALL (*)    Ketones, ur 15 (*)    Leukocytes,Ua LARGE (*)    All other components within normal limits  URINALYSIS,  MICROSCOPIC (REFLEX) - Abnormal; Notable for the following components:   Bacteria, UA FEW (*)    All other components within normal limits  WET PREP, GENITAL  PREGNANCY, URINE  GC/CHLAMYDIA PROBE AMP () NOT AT [facility]    EKG None  Radiology No results found.  Procedures Procedures   Medications Ordered in ED Medications - No data to display  ED Course  I have reviewed the triage vital signs and the nursing notes.  Pertinent labs & imaging results that were available during my care of the patient were reviewed by me and considered in my medical decision making (see chart for details).    MDM Rules/Calculators/A&P                          Patient here with vaginal itching and discharge.  I ordered and reviewed labs which includes urinalysis which shows likely vaginal contamination.  Urine is positive for trichomoniasis.  Patient will be treated with Flagyl.  Discussed safe sexual practices and outpatient follow-up.  She appears otherwise appropriate for discharge at this time Final Clinical Impression(s) / ED Diagnoses Final diagnoses:  None    Rx / DC Orders ED Discharge Orders    None       Arthor Captain, PA-C 04/15/20 1629    Pollyann Savoy, MD 04/15/20 1721

## 2020-04-12 NOTE — Discharge Instructions (Addendum)
Contact a health care provider if: You still have symptoms after you finish your medicine. You develop pain in your abdomen. You have pain when you urinate. You have bleeding after sex. You develop a rash. You feel nauseous or you vomit. You plan to become pregnant or think you may be pregnant.

## 2020-04-12 NOTE — ED Triage Notes (Signed)
Pt c/o vaginal itching and burning x 4 days. Pt reports frequent urination today.

## 2020-04-12 NOTE — ED Provider Notes (Incomplete)
[facility] EMERGENCY DEPARTMENT Provider Note   CSN: [id number] Arrival date & time: 04/12/20  2028     History Chief Complaint  Patient presents with  . Vaginal Itching    Taylor Clark is a 40 y.o. female who present with vaginal itching x4 days. She tried monistat without relief. She is having some suprapubic pain. She has not been sexually active for the past 3 months. She denies fevers, urinary sxs or back pain. LMP was the second week of February.  HPI     Past Medical History:  Diagnosis Date  . Anemia     There are no problems to display for this patient.   Past Surgical History:  Procedure Laterality Date  . CESAREAN SECTION    . HIP SURGERY  2011  . JOINT REPLACEMENT    . WRIST SURGERY       OB History    Gravida  5   Para  3   Term      Preterm      AB  2   Living        SAB      IAB      Ectopic      Multiple      Live Births              Family History  Problem Relation Age of Onset  . Non-Hodgkin's lymphoma Mother   . HIV/AIDS Father        died at age 93  . Anxiety disorder Sister   . Diabetes Maternal Grandmother   . Hypertension Maternal Grandfather   . Diabetes Paternal Grandmother   . Kidney failure Paternal Grandmother     Social History   Tobacco Use  . Smoking status: Current Some Day Smoker    Packs/day: 0.50    Types: Cigarettes  . Smokeless tobacco: Never Used  Vaping Use  . Vaping Use: Never used  Substance Use Topics  . Alcohol use: No  . Drug use: No    Home Medications Prior to Admission medications   Not on File    Allergies    Morphine sulfate  Review of Systems   Review of Systems  Constitutional: Negative for fever.  Genitourinary: Positive for dysuria, pelvic pain and vaginal discharge. Negative for frequency, hematuria and vaginal bleeding.    Physical Exam Updated Vital Signs BP 120/76 (BP Location: Right Arm)   Pulse 82   Temp 97.8 F (36.6 C) (Oral)    Resp 18   Ht 5' (1.524 m)   Wt 87 kg   SpO2 100%   BMI 37.48 kg/m   Physical Exam Vitals and nursing note reviewed. Exam conducted with a chaperone present.  Constitutional:      General: She is not in acute distress.    Appearance: She is well-developed and well-nourished. She is not diaphoretic.  HENT:     Head: Normocephalic and atraumatic.  Eyes:     General: No scleral icterus.    Conjunctiva/sclera: Conjunctivae normal.  Cardiovascular:     Rate and Rhythm: Normal rate and regular rhythm.     Heart sounds: Normal heart sounds. No murmur heard. No friction rub. No gallop.   Pulmonary:     Effort: Pulmonary effort is normal. No respiratory distress.     Breath sounds: Normal breath sounds.  Abdominal:     General: Bowel sounds are normal. There is no distension.     Palpations: Abdomen is soft. There  is no mass.     Tenderness: There is no abdominal tenderness. There is no guarding.  Genitourinary:    Exam position: Lithotomy position.     Labia:        Right: No rash, tenderness, lesion or injury.        Left: No rash, tenderness, lesion or injury.      Vagina: Vaginal discharge present.     Cervix: Normal.     Adnexa: Right adnexa normal and left adnexa normal.     Comments: Copious, thick white vaginal discharge. Musculoskeletal:     Cervical back: Normal range of motion.  Skin:    General: Skin is warm and dry.  Neurological:     Mental Status: She is alert and oriented to person, place, and time.  Psychiatric:        Behavior: Behavior normal.     ED Results / Procedures / Treatments   Labs (all labs ordered are listed, but only abnormal results are displayed) Labs Reviewed  URINALYSIS, ROUTINE W REFLEX MICROSCOPIC - Abnormal; Notable for the following components:      Result Value   APPearance CLOUDY (*)    Hgb urine dipstick SMALL (*)    Ketones, ur 15 (*)    Leukocytes,Ua LARGE (*)    All other components within normal limits  URINALYSIS,  MICROSCOPIC (REFLEX) - Abnormal; Notable for the following components:   Bacteria, UA FEW (*)    All other components within normal limits  WET PREP, GENITAL  PREGNANCY, URINE  GC/CHLAMYDIA PROBE AMP ([place]) NOT AT [facility]    EKG None  Radiology No results found.  Procedures Procedures {Remember to document critical care time when appropriate:1}  Medications Ordered in ED Medications - No data to display  ED Course  I have reviewed the triage vital signs and the nursing notes.  Pertinent labs & imaging results that were available during my care of the patient were reviewed by me and considered in my medical decision making (see chart for details).    MDM Rules/Calculators/A&P                          *** Final Clinical Impression(s) / ED Diagnoses Final diagnoses:  None    Rx / DC Orders ED Discharge Orders    None

## 2020-04-12 NOTE — ED Notes (Signed)
In to see Pt. Explained that we do not put side rales up unless she feels she is a fall risk.  Asked her if she walked in and she said she did.  Pt. Also said she did not have her call bell so RN made sure the call bell was placed in her lap on her bed.  Pt. Said her TV remote was not working and Charity fundraiser explained that some did not work and that the TV was no the top priority concern that Pt. Care was most important for RN .

## 2020-04-14 LAB — GC/CHLAMYDIA PROBE AMP (~~LOC~~) NOT AT ARMC
Chlamydia: NEGATIVE
Comment: NEGATIVE
Comment: NORMAL
Neisseria Gonorrhea: NEGATIVE

## 2020-04-23 ENCOUNTER — Encounter (HOSPITAL_BASED_OUTPATIENT_CLINIC_OR_DEPARTMENT_OTHER): Payer: Self-pay | Admitting: Emergency Medicine

## 2020-04-23 ENCOUNTER — Emergency Department (HOSPITAL_BASED_OUTPATIENT_CLINIC_OR_DEPARTMENT_OTHER)
Admission: EM | Admit: 2020-04-23 | Discharge: 2020-04-23 | Disposition: A | Discharge status: Home / Self Care | Payer: Medicaid Other | Attending: Emergency Medicine | Admitting: Emergency Medicine

## 2020-04-23 ENCOUNTER — Other Ambulatory Visit: Payer: Self-pay

## 2020-04-23 DIAGNOSIS — R519 Headache, unspecified: Secondary | ICD-10-CM | POA: Diagnosis present

## 2020-04-23 DIAGNOSIS — F17210 Nicotine dependence, cigarettes, uncomplicated: Secondary | ICD-10-CM | POA: Diagnosis not present

## 2020-04-23 DIAGNOSIS — J019 Acute sinusitis, unspecified: Secondary | ICD-10-CM | POA: Insufficient documentation

## 2020-04-23 DIAGNOSIS — R1110 Vomiting, unspecified: Secondary | ICD-10-CM | POA: Diagnosis not present

## 2020-04-23 MED ORDER — AZITHROMYCIN 250 MG PO TABS: 250.0000 mg | ORAL_TABLET | Freq: Every day | ORAL | 0 refills | Status: AC

## 2020-04-23 MED ORDER — AZITHROMYCIN 250 MG PO TABS
500.0000 mg | ORAL_TABLET | Freq: Once | ORAL | Status: AC
  Administered 2020-04-23: 500 mg via ORAL
  Filled 2020-04-23: qty 2

## 2020-04-23 MED ORDER — OXYMETAZOLINE HCL 0.05 % NA SOLN
1.0000 | Freq: Once | NASAL | Status: AC
  Administered 2020-04-23: 1 via NASAL
  Filled 2020-04-23: qty 30

## 2020-04-23 NOTE — ED Triage Notes (Signed)
Pt is c/o headache, sinus pressure and nosebleed  Pt states her headaches started on Tuesday  Thursday she had a dry throat and today states her throat has been raw feeling, had nausea, sinus pressure and her nose has been bleeding today

## 2020-04-23 NOTE — ED Provider Notes (Signed)
[facility] EMERGENCY DEPARTMENT Provider Note   CSN: [id number] Arrival date & time: 04/23/20  0034     History Chief Complaint  Patient presents with  . Headache    Taylor Clark is a 40 y.o. female.  Patient is a 40 year old female with history of anemia.  She presents today for evaluation of headache, sinus pressure, congestion, and postnasal drip for the past week.  This evening she was driving home from work, then had to pull over and vomit.  After she vomited, she had bleeding from her right nares.  This resolved with direct pressure.  She denies fevers or chills.  She denies visual disturbances, numbness, or tingling.  She does report a history of sinus infections and this feels similar.  The history is provided by the patient.       Past Medical History:  Diagnosis Date  . Anemia     There are no problems to display for this patient.   Past Surgical History:  Procedure Laterality Date  . CESAREAN SECTION    . HIP SURGERY  2011  . JOINT REPLACEMENT    . WRIST SURGERY       OB History    Gravida  5   Para  3   Term      Preterm      AB  2   Living        SAB      IAB      Ectopic      Multiple      Live Births              Family History  Problem Relation Age of Onset  . Non-Hodgkin's lymphoma Mother   . HIV/AIDS Father        died at age 8  . Anxiety disorder Sister   . Diabetes Maternal Grandmother   . Hypertension Maternal Grandfather   . Diabetes Paternal Grandmother   . Kidney failure Paternal Grandmother     Social History   Tobacco Use  . Smoking status: Current Every Day Smoker    Packs/day: 0.50    Types: Cigarettes  . Smokeless tobacco: Never Used  Vaping Use  . Vaping Use: Never used  Substance Use Topics  . Alcohol use: No  . Drug use: No    Home Medications Prior to Admission medications   Medication Sig Start Date End Date Taking? Authorizing Provider  metroNIDAZOLE (FLAGYL) 500 MG  tablet Take 1 tablet (500 mg total) by mouth 2 (two) times daily. One po bid x 7 days 04/12/20   Arthor Captain, PA-C    Allergies    Morphine sulfate  Review of Systems   Review of Systems  All other systems reviewed and are negative.   Physical Exam Updated Vital Signs BP 125/81 (BP Location: Left Arm)   Pulse 86   Temp 98.4 F (36.9 C) (Oral)   Resp 16   Ht 5\' 1"  (1.549 m)   Wt 86.6 kg   SpO2 96%   BMI 36.09 kg/m   Physical Exam Vitals and nursing note reviewed.  Constitutional:      General: She is not in acute distress.    Appearance: She is well-developed. She is not diaphoretic.  HENT:     Head: Normocephalic and atraumatic.     Comments: There is tenderness to palpation of the frontal and maxillary sinuses.  There is dried blood in the right nares, but no active bleeding or  obvious site of bleeding.    Mouth/Throat:     Mouth: Mucous membranes are moist.     Pharynx: Oropharynx is clear.  Eyes:     Extraocular Movements: Extraocular movements intact.     Pupils: Pupils are equal, round, and reactive to light.  Cardiovascular:     Rate and Rhythm: Normal rate and regular rhythm.     Heart sounds: No murmur heard. No friction rub. No gallop.   Pulmonary:     Effort: Pulmonary effort is normal. No respiratory distress.     Breath sounds: Normal breath sounds. No wheezing.  Abdominal:     General: Bowel sounds are normal. There is no distension.     Palpations: Abdomen is soft.     Tenderness: There is no abdominal tenderness.  Musculoskeletal:        General: Normal range of motion.     Cervical back: Normal range of motion and neck supple. No rigidity.  Lymphadenopathy:     Cervical: No cervical adenopathy.  Skin:    General: Skin is warm and dry.  Neurological:     Mental Status: She is alert and oriented to person, place, and time.     ED Results / Procedures / Treatments   Labs (all labs ordered are listed, but only abnormal results are  displayed) Labs Reviewed - No data to display  EKG None  Radiology No results found.  Procedures Procedures   Medications Ordered in ED Medications  azithromycin (ZITHROMAX) tablet 500 mg (has no administration in time range)  oxymetazoline (AFRIN) 0.05 % nasal spray 1 spray (has no administration in time range)    ED Course  I have reviewed the triage vital signs and the nursing notes.  Pertinent labs & imaging results that were available during my care of the patient were reviewed by me and considered in my medical decision making (see chart for details).    MDM Rules/Calculators/A&P  Patient presentation most consistent with acute sinusitis.  She will be given Zithromax, Afrin nasal spray, and advised to use Sudafed as a decongestant.  She is to return as needed for any problems.  Final Clinical Impression(s) / ED Diagnoses Final diagnoses:  None    Rx / DC Orders ED Discharge Orders    None       Geoffery Lyons, MD 04/23/20 [phone]

## 2020-04-23 NOTE — Discharge Instructions (Addendum)
Begin taking Zithromax as prescribed.  Use Afrin nasal spray, 1 spray in each nostril every 4 hours as needed for congestion.  Take Sudafed as per package instructions as needed for congestion.  Return to the emergency department if symptoms significantly worsen or change.

## 2021-01-13 ENCOUNTER — Emergency Department (HOSPITAL_BASED_OUTPATIENT_CLINIC_OR_DEPARTMENT_OTHER)
Admission: EM | Admit: 2021-01-13 | Discharge: 2021-01-14 | Disposition: A | Discharge status: Home / Self Care | Payer: Medicaid Other | Attending: Emergency Medicine | Admitting: Emergency Medicine

## 2021-01-13 ENCOUNTER — Other Ambulatory Visit: Payer: Self-pay

## 2021-01-13 ENCOUNTER — Encounter (HOSPITAL_BASED_OUTPATIENT_CLINIC_OR_DEPARTMENT_OTHER): Payer: Self-pay | Admitting: Emergency Medicine

## 2021-01-13 DIAGNOSIS — Z20822 Contact with and (suspected) exposure to covid-19: Secondary | ICD-10-CM | POA: Insufficient documentation

## 2021-01-13 DIAGNOSIS — J069 Acute upper respiratory infection, unspecified: Secondary | ICD-10-CM | POA: Diagnosis not present

## 2021-01-13 DIAGNOSIS — F17210 Nicotine dependence, cigarettes, uncomplicated: Secondary | ICD-10-CM | POA: Insufficient documentation

## 2021-01-13 DIAGNOSIS — R059 Cough, unspecified: Secondary | ICD-10-CM | POA: Diagnosis present

## 2021-01-13 NOTE — ED Triage Notes (Signed)
Patient arrived via POV c/o cough, congestion x 3 days. Patient states nasal drainage that is yellow in color. Patient is AO x 4, VS WDL, normal gait.

## 2021-01-14 LAB — RESP PANEL BY RT-PCR (FLU A&B, COVID) ARPGX2
Influenza A by PCR: NEGATIVE
Influenza B by PCR: NEGATIVE
SARS Coronavirus 2 by RT PCR: NEGATIVE

## 2021-01-14 MED ORDER — BENZONATATE 100 MG PO CAPS
100.0000 mg | ORAL_CAPSULE | Freq: Once | ORAL | Status: AC
  Administered 2021-01-14: 100 mg via ORAL
  Filled 2021-01-14: qty 1

## 2021-01-14 NOTE — ED Provider Notes (Signed)
[facility] EMERGENCY DEPARTMENT Provider Note  CSN: [id number] Arrival date & time: 01/13/21 2258  Chief Complaint(s) Cough  HPI Taylor Clark is a 40 y.o. female here with 3 days of nasal congestion and postnasal drip.  Gradual onset.  Persistent.  Worse by lying supine.  Has attempted to take over-the-counter medicine which provides temporary relief.  Denies any associated fevers or chills.  No facial pain.  No nuchal rigidity.  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  No nausea vomiting.  No shortness of breath.  No other physical complaints.  The history is provided by the patient.   Past Medical History Past Medical History:  Diagnosis Date   Anemia    There are no problems to display for this patient.  Home Medication(s) Prior to Admission medications   Medication Sig Start Date End Date Taking? Authorizing Provider  azithromycin (ZITHROMAX) 250 MG tablet Take 1 tablet (250 mg total) by mouth daily. 04/23/20   Geoffery Lyons, MD  metroNIDAZOLE (FLAGYL) 500 MG tablet Take 1 tablet (500 mg total) by mouth 2 (two) times daily. One po bid x 7 days 04/12/20   Arthor Captain, PA-C                                                                                                                                    Past Surgical History Past Surgical History:  Procedure Laterality Date   CESAREAN SECTION     HIP SURGERY  2011   JOINT REPLACEMENT     WRIST SURGERY     Family History Family History  Problem Relation Age of Onset   Non-Hodgkin's lymphoma Mother    HIV/AIDS Father        died at age 12   Anxiety disorder Sister    Diabetes Maternal Grandmother    Hypertension Maternal Grandfather    Diabetes Paternal Grandmother    Kidney failure Paternal Grandmother     Social History Social History   Tobacco Use   Smoking status: Every Day    Packs/day: 0.50    Types: Cigarettes   Smokeless tobacco: Never  Vaping Use   Vaping Use: Never  used  Substance Use Topics   Alcohol use: No   Drug use: No   Allergies Morphine sulfate  Review of Systems Review of Systems All other systems are reviewed and are negative for acute change except as noted in the HPI  Physical Exam Vital Signs  I have reviewed the triage vital signs BP 123/71 (BP Location: Left Arm)   Pulse 87   Temp 98.7 F (37.1 C) (Oral)   Resp 18   Ht 5' (1.524 m)   Wt 90.7 kg   LMP 12/26/2020 (Exact Date)   SpO2 100%   BMI 39.06 kg/m   Physical Exam Vitals reviewed.  Constitutional:      General: She is not in acute distress.  Appearance: She is well-developed. She is not diaphoretic.  HENT:     Head: Normocephalic and atraumatic.     Right Ear: Tympanic membrane and external ear normal.     Left Ear: Tympanic membrane and external ear normal.     Nose: Congestion present.     Mouth/Throat:     Lips: No lesions.     Tongue: No lesions.     Palate: No lesions.     Pharynx: No pharyngeal swelling, oropharyngeal exudate, posterior oropharyngeal erythema or uvula swelling.     Tonsils: No tonsillar exudate or tonsillar abscesses.     Comments: Post nasal drip with cobblestoning Eyes:     General: No scleral icterus.    Conjunctiva/sclera: Conjunctivae normal.  Neck:     Trachea: Phonation normal.  Cardiovascular:     Rate and Rhythm: Normal rate and regular rhythm.  Pulmonary:     Effort: Pulmonary effort is normal. No respiratory distress.     Breath sounds: No stridor.  Abdominal:     General: There is no distension.  Musculoskeletal:        General: Normal range of motion.     Cervical back: Normal range of motion.  Neurological:     Mental Status: She is alert and oriented to person, place, and time.  Psychiatric:        Behavior: Behavior normal.    ED Results and Treatments Labs (all labs ordered are listed, but only abnormal results are displayed) Labs Reviewed  RESP PANEL BY RT-PCR (FLU A&B, COVID) [id number]                                                                                                                          EKG  EKG Interpretation  Date/Time:    Ventricular Rate:    PR Interval:    QRS Duration:   QT Interval:    QTC Calculation:   R Axis:     Text Interpretation:         Radiology No results found.  Pertinent labs & imaging results that were available during my care of the patient were reviewed by me and considered in my medical decision making (see MDM for details).  Medications Ordered in ED Medications  benzonatate (TESSALON) capsule 100 mg (100 mg Oral Given 01/14/21 0031)  Procedures Procedures  (including critical care time)  Medical Decision Making / ED Course I have reviewed the nursing notes for this encounter and the patient's prior records (if available in EHR or on provided paperwork).  Taylor Clark was evaluated in Emergency Department on 01/14/2021 for the symptoms described in the history of present illness. She was evaluated in the context of the global COVID-19 pandemic, which necessitated consideration that the patient might be at risk for infection with the SARS-CoV-2 virus that causes COVID-19. Institutional protocols and algorithms that pertain to the evaluation of patients at risk for COVID-19 are in a state of rapid change based on information released by regulatory bodies including the CDC and federal and state organizations. These policies and algorithms were followed during the patient's care in the ED.     Patient presents with upper respiratory viral symptoms for 3 days. Adequate oral hydration. Rest of history as above.  Patient appears well. No signs of toxicity, patient is interactive. No hypoxia, tachypnea or other signs of respiratory distress. No sign of clinical dehydration. Rest of exam as above.  Most  consistent with viral illness.  COVID/Influenza negative  No evidence suggestive of pharyngitis, AOM, PNA.  Chest x-ray not indicated at this time.  Discussed symptomatic treatment with the patient and they will follow closely with their PCP.    Pertinent labs & imaging results that were available during my care of the patient were reviewed by me and considered in my medical decision making:    Final Clinical Impression(s) / ED Diagnoses Final diagnoses:  Viral URI with cough   The patient appears reasonably screened and/or stabilized for discharge and I doubt any other medical condition or other [facility] requiring further screening, evaluation, or treatment in the ED at this time prior to discharge. Safe for discharge with strict return precautions.  Disposition: Discharge  Condition: Good  I have discussed the results, Dx and Tx plan with the patient/family who expressed understanding and agree(s) with the plan. Discharge instructions discussed at length. The patient/family was given strict return precautions who verbalized understanding of the instructions. No further questions at time of discharge.    ED Discharge Orders     None         Follow Up: Copland, Gwenlyn Found, MD [address] [place] [address] [phone]  Call  as needed, if symptoms do not improve or  worsen in 5-7 days    This chart was dictated using voice recognition software.  Despite best efforts to proofread,  errors can occur which can change the documentation meaning.    Nira Conn, MD 01/14/21 [phone]

## 2021-01-14 NOTE — Discharge Instructions (Addendum)
You may take over-the-counter medicine for symptomatic relief, such as Tylenol, Motrin, TheraFlu, Alka seltzer , black elderberry, etc. Please limit acetaminophen (Tylenol) to 4000 mg and Ibuprofen (Motrin, Advil, etc.) to 2400 mg for a 24hr period. Please note that other over-the-counter medicine may contain acetaminophen or ibuprofen as a component of their ingredients.   

## 2021-11-27 ENCOUNTER — Emergency Department (HOSPITAL_BASED_OUTPATIENT_CLINIC_OR_DEPARTMENT_OTHER): Payer: Medicaid Other

## 2021-11-27 ENCOUNTER — Encounter (HOSPITAL_BASED_OUTPATIENT_CLINIC_OR_DEPARTMENT_OTHER): Payer: Self-pay

## 2021-11-27 ENCOUNTER — Emergency Department (HOSPITAL_BASED_OUTPATIENT_CLINIC_OR_DEPARTMENT_OTHER)
Admission: EM | Admit: 2021-11-27 | Discharge: 2021-11-27 | Disposition: A | Discharge status: Home / Self Care | Payer: Medicaid Other | Attending: Emergency Medicine | Admitting: Emergency Medicine

## 2021-11-27 ENCOUNTER — Other Ambulatory Visit: Payer: Self-pay

## 2021-11-27 DIAGNOSIS — M25472 Effusion, left ankle: Secondary | ICD-10-CM | POA: Insufficient documentation

## 2021-11-27 DIAGNOSIS — M25572 Pain in left ankle and joints of left foot: Secondary | ICD-10-CM | POA: Insufficient documentation

## 2021-11-27 DIAGNOSIS — X58XXXA Exposure to other specified factors, initial encounter: Secondary | ICD-10-CM | POA: Diagnosis not present

## 2021-11-27 DIAGNOSIS — S93402A Sprain of unspecified ligament of left ankle, initial encounter: Secondary | ICD-10-CM

## 2021-11-27 MED ORDER — NAPROXEN 500 MG PO TABS: 500.0000 mg | ORAL_TABLET | Freq: Two times a day (BID) | ORAL | 0 refills | Status: AC

## 2021-11-27 NOTE — ED Triage Notes (Signed)
C/o left ankle pain since Friday, denies known injury.

## 2021-11-27 NOTE — ED Provider Notes (Signed)
[place] EMERGENCY DEPARTMENT Provider Note   CSN: [id number] Arrival date & time: 11/27/21  1320     History  Chief Complaint  Patient presents with   Ankle Pain    Taylor Clark is a 41 y.o. female history of anemia, hip surgery presenting to the emergency department for evaluation of left ankle pain.  Patient states she was walking on the sidewalk on Friday, she thought that she was about to step up on a curb but there was no curb so she thought she might have sprained her ankle.  Since then she has been having trouble with walking that she has to hop on one foot.  She also noticed swelling around her ankle, pain on the left foot going up to her bilateral ankle and left calf.  Denies any shortness of breath, fever, chest pain, bowel changes, urinary symptoms, rash.  Has taken ibuprofen with minimal improvement.   Ankle Pain      Home Medications Prior to Admission medications   Medication Sig Start Date End Date Taking? Authorizing Provider  azithromycin (ZITHROMAX) 250 MG tablet Take 1 tablet (250 mg total) by mouth daily. 04/23/20   Veryl Speak, MD  metroNIDAZOLE (FLAGYL) 500 MG tablet Take 1 tablet (500 mg total) by mouth 2 (two) times daily. One po bid x 7 days 04/12/20   Margarita Mail, PA-C      Allergies    Morphine sulfate    Review of Systems   Review of Systems  Musculoskeletal:        L ankle pain    Physical Exam Updated Vital Signs BP 121/81 (BP Location: Left Arm)   Pulse 81   Temp 98.2 F (36.8 C) (Oral)   Resp 18   Ht 5' (1.524 m)   Wt 81.6 kg   LMP 11/21/2021   SpO2 99%   BMI 35.15 kg/m  Physical Exam Vitals and nursing note reviewed.  Constitutional:      Appearance: Normal appearance.  HENT:     Head: Normocephalic and atraumatic.     Mouth/Throat:     Mouth: Mucous membranes are moist.  Eyes:     General: No scleral icterus. Cardiovascular:     Rate and Rhythm: Normal rate and regular rhythm.     Pulses: Normal  pulses.     Heart sounds: Normal heart sounds.  Pulmonary:     Effort: Pulmonary effort is normal.     Breath sounds: Normal breath sounds.  Abdominal:     General: Abdomen is flat.     Palpations: Abdomen is soft.     Tenderness: There is no abdominal tenderness.  Musculoskeletal:        General: No deformity.     Comments: Mild swelling around the left lateral ankle.  No redness noticed.  Skin is cool to touch.  Limited range of motion of the left ankle. Normal strength bilateral lower extremities.  Skin:    General: Skin is warm.     Findings: No rash.  Neurological:     General: No focal deficit present.     Mental Status: She is alert.  Psychiatric:        Mood and Affect: Mood normal.     ED Results / Procedures / Treatments   Labs (all labs ordered are listed, but only abnormal results are displayed) Labs Reviewed - No data to display  EKG None  Radiology DG Ankle Complete Left  Result Date: 11/27/2021 CLINICAL DATA:  Trauma,  pain EXAM: LEFT ANKLE COMPLETE - 3+ VIEW COMPARISON:  Left foot radiographs done on 06/26/2016 FINDINGS: There is no evidence of fracture, dislocation, or joint effusion. There is no evidence of arthropathy or other focal bone abnormality. Soft tissues are unremarkable. IMPRESSION: No fracture or dislocation is seen in left ankle. Electronically Signed   By: Ernie Avena M.D.   On: 11/27/2021 14:02    Procedures Procedures    Medications Ordered in ED Medications - No data to display  ED Course/ Medical Decision Making/ A&P                           Medical Decision Making Amount and/or Complexity of Data Reviewed Radiology: ordered.  Risk Prescription drug management.   This patient presents to the ED for concern of ankle pain, this involves an extensive number of treatment options, and is a complaint that carries with it a high risk of complications and morbidity.  The differential diagnosis includes ankle fracture,  dislocation, DVT, ankle sprain, musculoskeletal pain. Co morbidities that complicate the patient evaluation  See HPI Additional history obtained:  Additional history obtained from EMR External records from outside source obtained and reviewed including Care Everywhere/External Records and Primary Care Documents Lab Tests:  na Imaging Studies ordered:  na Cardiac Monitoring: / EKG:  The patient was maintained on a cardiac monitor.  I personally viewed and interpreted the cardiac monitored which showed an underlying rhythm of: sinus rhythm Consultations Obtained:  na Problem List / ED Course / Critical interventions / Medication management  Ankle pain Vitals signs within normal range and stable throughout visit Laboratory/imaging studies significant for: See above On physical examination, patient is afebrile and appears in no acute distress.  She does have tenderness to palpation to the left lateral ankle, left foot and left calf.  X-ray of the left ankle negative. Patient's presentations are most concerned for ankle sprain. Low suspicion for fracture or dislocation as x-ray of the ankle negative.  We will send patient home with naproxen for pain control.  I ordered ankle brace to prevent further injury to the ankle.  I advised patient to rest the ankle, apply ice to the injured area, take naproxen/ibuprofen for pain. I ordered medication including naproxen for pain Reevaluation of the patient after these medicines showed that the patient stayed the same I have reviewed the patients home medicines and have made adjustments as needed Continued outpatient therapy. Follow-up with PCP recommended for reevaluation of symptoms. Treatment plan discussed with patient.  Pt acknowledged understanding was agreeable to the plan. Social Determinants of Health:  N/A Test / Admission / Dispo - Considered:  Worrisome signs and symptoms were discussed with patient, and patient acknowledged  understanding to return to the ED if they noticed these signs and symptoms. Patient was stable upon discharge.          Final Clinical Impression(s) / ED Diagnoses Final diagnoses:  None    Rx / DC Orders ED Discharge Orders          Ordered    naproxen (NAPROSYN) 500 MG tablet  2 times daily        11/27/21 1549              Jeanelle Malling, [place] 11/27/21 Lyndal Pulley, MD 11/30/21 1436

## 2021-11-27 NOTE — Discharge Instructions (Signed)
Your ankle x-ray today showed no evidence of fracture or dislocation.  Please take Tylenol/naproxen/ibuprofen for pain.  Rest your ankle.  Apply ice to the injured area. Keep ankle brace on and further injury. I recommend close follow-up with your PCP for reevaluation.  Please do not hesitate to return to emergency department if worrisome signs symptoms we discussed become apparent.

## 2023-01-20 ENCOUNTER — Encounter (HOSPITAL_BASED_OUTPATIENT_CLINIC_OR_DEPARTMENT_OTHER): Payer: Self-pay | Admitting: Emergency Medicine

## 2023-01-20 ENCOUNTER — Other Ambulatory Visit: Payer: Self-pay

## 2023-01-20 ENCOUNTER — Emergency Department (HOSPITAL_BASED_OUTPATIENT_CLINIC_OR_DEPARTMENT_OTHER)
Admission: EM | Admit: 2023-01-20 | Discharge: 2023-01-20 | Disposition: A | Discharge status: Home / Self Care | Payer: No Typology Code available for payment source | Attending: Emergency Medicine | Admitting: Emergency Medicine

## 2023-01-20 DIAGNOSIS — J0100 Acute maxillary sinusitis, unspecified: Secondary | ICD-10-CM | POA: Diagnosis not present

## 2023-01-20 DIAGNOSIS — R059 Cough, unspecified: Secondary | ICD-10-CM | POA: Diagnosis present

## 2023-01-20 MED ORDER — AMOXICILLIN-POT CLAVULANATE 875-125 MG PO TABS: 1.0000 | ORAL_TABLET | Freq: Two times a day (BID) | ORAL | 0 refills | Status: DC

## 2023-01-20 MED ORDER — ONDANSETRON 4 MG PO TBDP: ORAL_TABLET | ORAL | 0 refills | Status: DC

## 2023-01-20 MED ORDER — BENZONATATE 100 MG PO CAPS: 100.0000 mg | ORAL_CAPSULE | Freq: Three times a day (TID) | ORAL | 0 refills | Status: AC

## 2023-01-20 MED ORDER — BENZONATATE 100 MG PO CAPS: 100.0000 mg | ORAL_CAPSULE | Freq: Three times a day (TID) | ORAL | 0 refills | Status: DC

## 2023-01-20 MED ORDER — AMOXICILLIN-POT CLAVULANATE 875-125 MG PO TABS
1.0000 | ORAL_TABLET | Freq: Once | ORAL | Status: AC
  Administered 2023-01-20: 1 via ORAL
  Filled 2023-01-20: qty 1

## 2023-01-20 MED ORDER — ONDANSETRON 4 MG PO TBDP: ORAL_TABLET | ORAL | 0 refills | Status: AC

## 2023-01-20 MED ORDER — AMOXICILLIN-POT CLAVULANATE 875-125 MG PO TABS: 1.0000 | ORAL_TABLET | Freq: Two times a day (BID) | ORAL | 0 refills | Status: AC

## 2023-01-20 NOTE — ED Triage Notes (Signed)
Cold sx x 2 weeks, facial pain x 3 days.

## 2023-01-20 NOTE — ED Provider Notes (Signed)
[name] Provider Note   CSN: [id number] Arrival date & time: 01/20/23  0135     History  Chief Complaint  Patient presents with   Facial Pain    Taylor Clark is a 42 y.o. female.  42 yo F with a chief complaints of nasal congestion cough congestion going on for couple days.  Planing mostly about how much her face is hurts and swollen.  She also has noticed her blood pressure has been elevated.  One of her children is sick with a similar illness.        Home Medications Prior to Admission medications   Medication Sig Start Date End Date Taking? Authorizing Provider  amoxicillin-clavulanate (AUGMENTIN) 875-125 MG tablet Take 1 tablet by mouth every 12 (twelve) hours. 01/20/23   Melene Plan, DO  azithromycin (ZITHROMAX) 250 MG tablet Take 1 tablet (250 mg total) by mouth daily. 04/23/20   Geoffery Lyons, MD  benzonatate (TESSALON) 100 MG capsule Take 1 capsule (100 mg total) by mouth every 8 (eight) hours. 01/20/23   Melene Plan, DO  metroNIDAZOLE (FLAGYL) 500 MG tablet Take 1 tablet (500 mg total) by mouth 2 (two) times daily. One po bid x 7 days 04/12/20   Arthor Captain, PA-C  naproxen (NAPROSYN) 500 MG tablet Take 1 tablet (500 mg total) by mouth 2 (two) times daily. 11/27/21   Jeanelle Malling, PA  ondansetron (ZOFRAN-ODT) 4 MG disintegrating tablet 4mg  ODT q4 hours prn nausea/vomit 01/20/23   Melene Plan, DO      Allergies    Morphine sulfate    Review of Systems   Review of Systems  Physical Exam Updated Vital Signs BP (!) 143/91 (BP Location: Right Arm)   Pulse 75   Temp 98 F (36.7 C) (Oral)   Resp 16   Ht 5\' 1"  (1.549 m)   Wt 86.2 kg   SpO2 97%   BMI 35.90 kg/m  Physical Exam Vitals and nursing note reviewed.  Constitutional:      General: She is not in acute distress.    Appearance: She is well-developed. She is not diaphoretic.  HENT:     Head: Normocephalic and atraumatic.     Comments: Swollen turbinates,  posterior nasal drip, left maxillary sinuses exquisitely tender to percussion, tm normal bilaterally.   Eyes:     Pupils: Pupils are equal, round, and reactive to light.  Cardiovascular:     Rate and Rhythm: Normal rate and regular rhythm.     Heart sounds: No murmur heard.    No friction rub. No gallop.  Pulmonary:     Effort: Pulmonary effort is normal.     Breath sounds: No wheezing or rales.  Abdominal:     General: There is no distension.     Palpations: Abdomen is soft.     Tenderness: There is no abdominal tenderness.  Musculoskeletal:        General: No tenderness.     Cervical back: Normal range of motion and neck supple.  Skin:    General: Skin is warm and dry.  Neurological:     Mental Status: She is alert and oriented to person, place, and time.  Psychiatric:        Behavior: Behavior normal.     ED Results / Procedures / Treatments   Labs (all labs ordered are listed, but only abnormal results are displayed) Labs Reviewed - No data to display  EKG None  Radiology No results  found.  Procedures Procedures    Medications Ordered in ED Medications  amoxicillin-clavulanate (AUGMENTIN) 875-125 MG per tablet 1 tablet (1 tablet Oral Given 01/20/23 0215)    ED Course/ Medical Decision Making/ A&P                                 Medical Decision Making Risk Prescription drug management.   35 yoF with a chief complaints of cough congestion sinus pain and pressure.  She is exquisitely tender on percussion into the left maxillary sinus.  Will treat with antibiotics.  PCP follow-up.  2:25 AM:  I have discussed the diagnosis/risks/treatment options with the patient.  Evaluation and diagnostic testing in the emergency department does not suggest an emergent condition requiring admission or immediate intervention beyond what has been performed at this time.  They will follow up with PCP. We also discussed returning to the ED immediately if new or worsening sx  occur. We discussed the sx which are most concerning (e.g., sudden worsening pain, fever, inability to tolerate by mouth) that necessitate immediate return. Medications administered to the patient during their visit and any new prescriptions provided to the patient are listed below.  Medications given during this visit Medications  amoxicillin-clavulanate (AUGMENTIN) 875-125 MG per tablet 1 tablet (1 tablet Oral Given 01/20/23 0215)     The patient appears reasonably screen and/or stabilized for discharge and I doubt any other medical condition or other [facility] requiring further screening, evaluation, or treatment in the ED at this time prior to discharge.          Final Clinical Impression(s) / ED Diagnoses Final diagnoses:  Acute maxillary sinusitis, recurrence not specified    Rx / DC Orders ED Discharge Orders          Ordered    amoxicillin-clavulanate (AUGMENTIN) 875-125 MG tablet  Every 12 hours,   Status:  Discontinued        01/20/23 0202    ondansetron (ZOFRAN-ODT) 4 MG disintegrating tablet  Status:  Discontinued        01/20/23 0202    benzonatate (TESSALON) 100 MG capsule  Every 8 hours,   Status:  Discontinued        01/20/23 0202    amoxicillin-clavulanate (AUGMENTIN) 875-125 MG tablet  Every 12 hours        01/20/23 0214    benzonatate (TESSALON) 100 MG capsule  Every 8 hours        01/20/23 0214    ondansetron (ZOFRAN-ODT) 4 MG disintegrating tablet        01/20/23 0214              Melene Plan, DO 01/20/23 0225

## 2023-01-20 NOTE — Discharge Instructions (Signed)
Take tylenol 2 pills 4 times a day and motrin 4 pills 3 times a day.  Drink plenty of fluids.  Return for worsening shortness of breath, headache, confusion. Follow up with your family doctor.
